# Patient Record
Sex: Male | Born: 1945 | Race: White | Hispanic: No | Marital: Married | Smoking: Never smoker
Health system: Southern US, Community
[De-identification: ages and names within clinical notes are randomized; demographics above are authoritative.]

## PROBLEM LIST (undated history)

## (undated) DIAGNOSIS — N400 Enlarged prostate without lower urinary tract symptoms: Secondary | ICD-10-CM

## (undated) DIAGNOSIS — S270XXA Traumatic pneumothorax, initial encounter: Secondary | ICD-10-CM

## (undated) DIAGNOSIS — E785 Hyperlipidemia, unspecified: Secondary | ICD-10-CM

## (undated) DIAGNOSIS — L409 Psoriasis, unspecified: Secondary | ICD-10-CM

## (undated) DIAGNOSIS — I10 Essential (primary) hypertension: Secondary | ICD-10-CM

## (undated) DIAGNOSIS — Z8249 Family history of ischemic heart disease and other diseases of the circulatory system: Secondary | ICD-10-CM

## (undated) DIAGNOSIS — K219 Gastro-esophageal reflux disease without esophagitis: Secondary | ICD-10-CM

## (undated) DIAGNOSIS — M1990 Unspecified osteoarthritis, unspecified site: Secondary | ICD-10-CM

## (undated) DIAGNOSIS — G20 Parkinson's disease: Secondary | ICD-10-CM

## (undated) HISTORY — DX: Parkinson's disease: G20

## (undated) HISTORY — DX: Family history of ischemic heart disease and other diseases of the circulatory system: Z82.49

## (undated) HISTORY — DX: Benign prostatic hyperplasia without lower urinary tract symptoms: N40.0

## (undated) HISTORY — PX: COLONOSCOPY: SHX174

## (undated) HISTORY — PX: HIP ARTHROPLASTY: SHX981

---

## 2005-02-21 ENCOUNTER — Ambulatory Visit: Payer: Self-pay | Admitting: Gastroenterology

## 2005-03-28 ENCOUNTER — Ambulatory Visit: Payer: Self-pay | Admitting: Gastroenterology

## 2006-05-21 ENCOUNTER — Ambulatory Visit: Payer: Self-pay | Admitting: Gastroenterology

## 2006-06-04 ENCOUNTER — Ambulatory Visit: Payer: Self-pay | Admitting: Gastroenterology

## 2012-04-27 HISTORY — PX: NM MYOCAR PERF WALL MOTION: HXRAD629

## 2012-05-27 HISTORY — PX: OTHER SURGICAL HISTORY: SHX169

## 2013-01-05 ENCOUNTER — Emergency Department (HOSPITAL_BASED_OUTPATIENT_CLINIC_OR_DEPARTMENT_OTHER)
Admission: EM | Admit: 2013-01-05 | Discharge: 2013-01-05 | Disposition: A | Discharge status: Home / Self Care | Payer: Medicare Other | Attending: Emergency Medicine | Admitting: Emergency Medicine

## 2013-01-05 ENCOUNTER — Encounter (HOSPITAL_BASED_OUTPATIENT_CLINIC_OR_DEPARTMENT_OTHER): Payer: Self-pay

## 2013-01-05 DIAGNOSIS — Z8639 Personal history of other endocrine, nutritional and metabolic disease: Secondary | ICD-10-CM | POA: Insufficient documentation

## 2013-01-05 DIAGNOSIS — X503XXA Overexertion from repetitive movements, initial encounter: Secondary | ICD-10-CM | POA: Insufficient documentation

## 2013-01-05 DIAGNOSIS — Y9389 Activity, other specified: Secondary | ICD-10-CM | POA: Insufficient documentation

## 2013-01-05 DIAGNOSIS — I10 Essential (primary) hypertension: Secondary | ICD-10-CM | POA: Insufficient documentation

## 2013-01-05 DIAGNOSIS — Z862 Personal history of diseases of the blood and blood-forming organs and certain disorders involving the immune mechanism: Secondary | ICD-10-CM | POA: Insufficient documentation

## 2013-01-05 DIAGNOSIS — M5432 Sciatica, left side: Secondary | ICD-10-CM

## 2013-01-05 DIAGNOSIS — M5430 Sciatica, unspecified side: Secondary | ICD-10-CM | POA: Insufficient documentation

## 2013-01-05 DIAGNOSIS — Z79899 Other long term (current) drug therapy: Secondary | ICD-10-CM | POA: Insufficient documentation

## 2013-01-05 DIAGNOSIS — Z7982 Long term (current) use of aspirin: Secondary | ICD-10-CM | POA: Insufficient documentation

## 2013-01-05 DIAGNOSIS — Y929 Unspecified place or not applicable: Secondary | ICD-10-CM | POA: Insufficient documentation

## 2013-01-05 DIAGNOSIS — Z872 Personal history of diseases of the skin and subcutaneous tissue: Secondary | ICD-10-CM | POA: Insufficient documentation

## 2013-01-05 DIAGNOSIS — K219 Gastro-esophageal reflux disease without esophagitis: Secondary | ICD-10-CM | POA: Insufficient documentation

## 2013-01-05 HISTORY — DX: Essential (primary) hypertension: I10

## 2013-01-05 HISTORY — DX: Gastro-esophageal reflux disease without esophagitis: K21.9

## 2013-01-05 HISTORY — DX: Psoriasis, unspecified: L40.9

## 2013-01-05 HISTORY — DX: Hyperlipidemia, unspecified: E78.5

## 2013-01-05 MED ORDER — OXYCODONE-ACETAMINOPHEN 5-325 MG PO TABS: 1.0000 | ORAL_TABLET | ORAL | Status: DC | PRN

## 2013-01-05 MED ORDER — OXYCODONE-ACETAMINOPHEN 5-325 MG PO TABS
1.0000 | ORAL_TABLET | Freq: Once | ORAL | Status: AC
  Administered 2013-01-05: 1 via ORAL
  Filled 2013-01-05 (×2): qty 1

## 2013-01-05 MED ORDER — CYCLOBENZAPRINE HCL 10 MG PO TABS: 10.0000 mg | ORAL_TABLET | Freq: Three times a day (TID) | ORAL | Status: DC | PRN

## 2013-01-05 MED ORDER — CYCLOBENZAPRINE HCL 10 MG PO TABS
10.0000 mg | ORAL_TABLET | Freq: Once | ORAL | Status: AC
  Administered 2013-01-05: 10 mg via ORAL
  Filled 2013-01-05: qty 1

## 2013-01-05 NOTE — ED Notes (Signed)
Pt states that he was loading wood onto a truck Saturday a week ago.  Pt states that he has severe back pain in the lower back bilaterally, radiating down the hips into the legs.  Pt states that pain increases with any position change or movement.  Ambulated with minor difficulty to the triage from waiting room.

## 2013-01-05 NOTE — ED Provider Notes (Signed)
History     CSN: [id number]  Arrival date & time 01/05/13  0916   First MD Initiated Contact with Patient 01/05/13 [phone]      Chief Complaint  Patient presents with  . Back Pain    (Consider location/radiation/quality/duration/timing/severity/associated sxs/prior treatment) Patient is a 67 y.o. male presenting with back pain. The history is provided by the patient and the spouse. No language interpreter was used.  Back Pain Location:  Lumbar spine (Pt had moved some firewood 8 days ago, and has had back pain since then.) Quality:  Shooting (Sharp, like lightning, going from low back to the left leg, down to the left knee.) Radiates to:  L posterior upper leg Pain severity:  Severe Onset quality:  Sudden Duration:  8 days Timing:  Constant Progression:  Worsening Context: lifting heavy objects   Relieved by:  Nothing Worsened by:  Movement, bending and twisting Ineffective treatments:  Lying down, being still and heating pad Associated symptoms: no bladder incontinence, no bowel incontinence, no fever and no numbness     Past Medical History  Diagnosis Date  . Hypertension   . Psoriasis   . GERD (gastroesophageal reflux disease)   . Hyperlipidemia     Past Surgical History  Procedure Laterality Date  . Hip arthroplasty      History reviewed. No pertinent family history.  History  Substance Use Topics  . Smoking status: Never Smoker   . Smokeless tobacco: Never Used  . Alcohol Use: No      Review of Systems  Constitutional: Negative.  Negative for fever and chills.  HENT: Negative.   Eyes: Negative.   Respiratory: Negative.   Cardiovascular: Negative.   Gastrointestinal: Negative.  Negative for bowel incontinence.  Genitourinary: Negative.  Negative for bladder incontinence.  Musculoskeletal: Positive for back pain.  Skin: Negative.   Neurological: Negative for numbness.  Psychiatric/Behavioral: Negative.     Allergies  Review of patient's allergies  indicates no known allergies.  Home Medications   Current Outpatient Rx  Name  Route  Sig  Dispense  Refill  . acetaminophen (TYLENOL) 500 MG tablet   Oral   Take 1,000 mg by mouth every 6 (six) hours as needed for pain.         [name] aspirin 81 MG tablet   Oral   Take 81 mg by mouth daily.         . fenofibrate (TRICOR) 145 MG tablet   Oral   Take 145 mg by mouth daily.         . metoprolol (LOPRESSOR) 100 MG tablet   Oral   Take 50 mg by mouth 2 (two) times daily.         [name] omeprazole (PRILOSEC) 20 MG capsule   Oral   Take 20 mg by mouth daily.         . cyclobenzaprine (FLEXERIL) 10 MG tablet   Oral   Take 1 tablet (10 mg total) by mouth 3 (three) times daily as needed for muscle spasms.   15 tablet   0   . oxyCODONE-acetaminophen (PERCOCET/ROXICET) 5-325 MG per tablet   Oral   Take 1 tablet by mouth every 4 (four) hours as needed for pain.   20 tablet   0     BP 123/80  Pulse 75  Temp(Src) 97.9 F (36.6 C) (Oral)  Resp 20  Ht 5\' 10"  (1.778 m)  Wt 215 lb (97.523 kg)  BMI 30.85 kg/m2  SpO2 98%  Physical  Exam  Constitutional: He is oriented to person, place, and time. He appears well-developed and well-nourished. Distressed: in moderate distress with low back pain radiating to the left leg.  HENT:  Head: Normocephalic and atraumatic.  Eyes: EOM are normal. Pupils are equal, round, and reactive to light.  Neck: Normal range of motion. Neck supple.  Cardiovascular: Normal rate and regular rhythm.   Pulmonary/Chest: Effort normal.  Abdominal: Soft. Bowel sounds are normal.  Musculoskeletal:  He localizes pain to the left lumbar region. There is no palpable bony deformity or point tenderness there.  Neurological: He is alert and oriented to person, place, and time.  No sensory or motor deficit.  Skin: Skin is warm and dry.  He has an extensive psoriatic rash on his back.  Psychiatric: He has a normal mood and affect. His behavior is normal.    ED  Course  Procedures (including critical care time)  10:28 AM Patient was advised that pain was probably related to injury to his lumbar disc which causes pain running into his left leg, a condition commonly called sciatica. He can take Percocet every 4 ours as needed for pain and Flexeril 10 mg 3 times a day for muscle spasm. He can use a heating pad with a low setting to try and get symptomatic relief. He should follow this up with Marjory Lies M.D., his family physician.    1. Sciatica neuralgia, left           Carleene Cooper III, MD 01/05/13 1029

## 2013-01-08 ENCOUNTER — Emergency Department (HOSPITAL_COMMUNITY)
Admission: EM | Admit: 2013-01-08 | Discharge: 2013-01-08 | Disposition: A | Discharge status: Home / Self Care | Payer: Medicare Other | Attending: Emergency Medicine | Admitting: Emergency Medicine

## 2013-01-08 ENCOUNTER — Encounter (HOSPITAL_COMMUNITY): Payer: Self-pay | Admitting: *Deleted

## 2013-01-08 DIAGNOSIS — Z7982 Long term (current) use of aspirin: Secondary | ICD-10-CM | POA: Insufficient documentation

## 2013-01-08 DIAGNOSIS — M549 Dorsalgia, unspecified: Secondary | ICD-10-CM

## 2013-01-08 DIAGNOSIS — I10 Essential (primary) hypertension: Secondary | ICD-10-CM | POA: Insufficient documentation

## 2013-01-08 DIAGNOSIS — E785 Hyperlipidemia, unspecified: Secondary | ICD-10-CM | POA: Insufficient documentation

## 2013-01-08 DIAGNOSIS — K219 Gastro-esophageal reflux disease without esophagitis: Secondary | ICD-10-CM | POA: Insufficient documentation

## 2013-01-08 DIAGNOSIS — Z872 Personal history of diseases of the skin and subcutaneous tissue: Secondary | ICD-10-CM | POA: Insufficient documentation

## 2013-01-08 DIAGNOSIS — M545 Low back pain, unspecified: Secondary | ICD-10-CM | POA: Insufficient documentation

## 2013-01-08 DIAGNOSIS — Z79899 Other long term (current) drug therapy: Secondary | ICD-10-CM | POA: Insufficient documentation

## 2013-01-08 MED ORDER — OXYCODONE-ACETAMINOPHEN 5-325 MG PO TABS: 1.0000 | ORAL_TABLET | ORAL | Status: DC | PRN

## 2013-01-08 MED ORDER — IBUPROFEN 800 MG PO TABS: 800.0000 mg | ORAL_TABLET | Freq: Three times a day (TID) | ORAL | Status: DC

## 2013-01-08 MED ORDER — CYCLOBENZAPRINE HCL 10 MG PO TABS: 10.0000 mg | ORAL_TABLET | Freq: Three times a day (TID) | ORAL | Status: DC | PRN

## 2013-01-08 MED ORDER — DIAZEPAM 5 MG PO TABS
5.0000 mg | ORAL_TABLET | Freq: Once | ORAL | Status: AC
  Administered 2013-01-08: 5 mg via ORAL
  Filled 2013-01-08: qty 1

## 2013-01-08 MED ORDER — KETOROLAC TROMETHAMINE 60 MG/2ML IM SOLN
60.0000 mg | Freq: Once | INTRAMUSCULAR | Status: AC
  Administered 2013-01-08: 60 mg via INTRAMUSCULAR
  Filled 2013-01-08: qty 2

## 2013-01-08 NOTE — ED Notes (Signed)
Pt able to ambulate in room. Reports pain 7/10

## 2013-01-08 NOTE — ED Provider Notes (Signed)
History     CSN: [id number]  Arrival date & time 01/08/13  1308   First MD Initiated Contact with Patient 01/08/13 [phone]      Chief Complaint  Patient presents with  . Back Pain    (Consider location/radiation/quality/duration/timing/severity/associated sxs/prior treatment) HPI Comments: Patient is a 67 year old male who presents with gradual onset of lower back pain after moving wood that started a couple months ago. The pain is dull, aching and severe and does radiate down bilateral legs. The pain is constant. Movement makes the pain worse. Nothing makes the pain better. Patient has tried Percocet and Flexeri for pain which provided some relief. No associated symptoms. No saddles paresthesias or bladder/bowel incontinence. Patient was seen here last week for the same and was recommended to follow up with his PCP which he did not.     Patient is a 67 y.o. male presenting with back pain.  Back Pain   Past Medical History  Diagnosis Date  . Hypertension   . Psoriasis   . GERD (gastroesophageal reflux disease)   . Hyperlipidemia     Past Surgical History  Procedure Laterality Date  . Hip arthroplasty      History reviewed. No pertinent family history.  History  Substance Use Topics  . Smoking status: Never Smoker   . Smokeless tobacco: Never Used  . Alcohol Use: No      Review of Systems  Musculoskeletal: Positive for back pain.  All other systems reviewed and are negative.    Allergies  Review of patient's allergies indicates no known allergies.  Home Medications   Current Outpatient Rx  Name  Route  Sig  Dispense  Refill  . acetaminophen (TYLENOL) 500 MG tablet   Oral   Take 1,000 mg by mouth every 6 (six) hours as needed for pain.         [name] aspirin 81 MG tablet   Oral   Take 81 mg by mouth daily.         . cyclobenzaprine (FLEXERIL) 10 MG tablet   Oral   Take 1 tablet (10 mg total) by mouth 3 (three) times daily as needed for muscle spasms.  15 tablet   0   . fenofibrate (TRICOR) 145 MG tablet   Oral   Take 145 mg by mouth daily.         . fish oil-omega-3 fatty acids 1000 MG capsule   Oral   Take 2 g by mouth daily.         . Flaxseed, Linseed, (FLAX SEEDS PO)   Oral   Take 1 tablet by mouth every morning.         . metoprolol (LOPRESSOR) 100 MG tablet   Oral   Take 50 mg by mouth 2 (two) times daily.         [name] omeprazole (PRILOSEC) 20 MG capsule   Oral   Take 20 mg by mouth daily.         [name] oxyCODONE-acetaminophen (PERCOCET/ROXICET) 5-325 MG per tablet   Oral   Take 1 tablet by mouth every 4 (four) hours as needed for pain.   20 tablet   0     BP 138/85  Pulse 103  Temp(Src) 97.3 F (36.3 C) (Oral)  Resp 20  SpO2 96%  Physical Exam  Nursing note and vitals reviewed. Constitutional: He is oriented to person, place, and time. He appears well-developed and well-nourished. No distress.  HENT:  Head: Normocephalic and atraumatic.  Eyes: Conjunctivae and EOM are normal.  Neck: Normal range of motion. Neck supple.  Cardiovascular: Normal rate and regular rhythm.  Exam reveals no gallop and no friction rub.   No murmur heard. Pulmonary/Chest: Effort normal and breath sounds normal. He has no wheezes. He has no rales. He exhibits no tenderness.  Abdominal: Soft. He exhibits no distension. There is no tenderness. There is no rebound.  Musculoskeletal: Normal range of motion.  Tenderness to palpation of L5 and paraspinal areas.   Neurological: He is alert and oriented to person, place, and time. Coordination normal.  Speech is goal-oriented. Moves limbs without ataxia.   Skin: Skin is warm and dry.  Psychiatric: He has a normal mood and affect. His behavior is normal.    ED Course  Procedures (including critical care time)  Labs Reviewed - No data to display No results found.   1. Back pain       MDM  9:53 AM Patient given toradol and valium for pain. Patient will follow up with his  PCP. I will also give him the name of an Orthopedist to follow up with. Patient will have 800mg  iburprofen, Percocet, and robaxin prescriptions for pain. No bladder/bowel incontinence or saddle paresthesias.         Emilia Beck, PA-C 01/08/13 1018

## 2013-01-08 NOTE — ED Notes (Signed)
Pt alert and oriented x4. Respirations even and unlabored, bilateral symmetrical rise and fall of chest. Skin warm and dry. In no acute distress. Denies needs.   

## 2013-01-08 NOTE — ED Notes (Signed)
Pt seen in ED 2/9. Pt reports bil lower back pain. Radiates down legs. Has been to the chiropractor last 2 days with no relief. Decided to come back to ED. Pain upon movement 10/10

## 2013-01-08 NOTE — ED Notes (Signed)
Pt escorted to discharge window. Pt verbalized understanding discharge instructions. In no acute distress.  

## 2013-01-09 NOTE — ED Provider Notes (Signed)
Medical screening examination/treatment/procedure(s) were performed by non-physician practitioner and as supervising physician I was immediately available for consultation/collaboration.  Hoke Baer T Keyonta Madrid, MD 01/09/13 0752 

## 2013-01-13 ENCOUNTER — Encounter (HOSPITAL_COMMUNITY): Payer: Self-pay | Admitting: Pharmacy Technician

## 2013-01-14 DIAGNOSIS — M5126 Other intervertebral disc displacement, lumbar region: Secondary | ICD-10-CM | POA: Diagnosis present

## 2013-01-14 NOTE — H&P (Signed)
History of Present Illness The patient is a 67 year old male who presents today for follow up of their back. The patient is being followed for their back pain. The patient states that they are doing poorly. Note for "Follow-up back": discuss  Subjective Transcription  He returns today for preoperative evaluation and review of his MRI. He continues to have severe, disabling left anterior thigh pain.  Allergies No Known Drug Allergies.  Social History Tobacco use. never smoker  Family History Heart Disease. mother   Social History Alcohol use. never consumed alcohol Children. 2 Current work status. retired Financial planner (Currently). no Drug/Alcohol Rehab (Previously). no Exercise. Exercises rarely; does running / walking Illicit drug use. no Living situation. live with spouse Marital status. married Pain Contract. no Tobacco use. never smoker  Past Surgical History Other Orthopaedic Surgery   Other Problems Gastroesophageal Reflux Disease High blood pressure Hypercholesterolemia   Review of Systems General:Not Present- Chills, Fever, Night Sweats, Appetite Loss, Fatigue, Feeling sick, Weight Gain and Weight Loss. Skin:Present- Itching, Rash and Psoriasis. Not Present- Skin Color Changes, Ulcer and Change in Hair or Nails. HEENT:Not Present- Sensitivity to light, Hearing problems, Nose Bleed and Ringing in the Ears. Neck:Not Present- Swollen Glands and Neck Mass. Respiratory:Not Present- Snoring, Chronic Cough, Bloody sputum and Dyspnea. Cardiovascular:Not Present- Shortness of Breath, Chest Pain, Swelling of Extremities, Leg Cramps and Palpitations. Gastrointestinal:Not Present- Bloody Stool, Heartburn, Abdominal Pain, Vomiting, Nausea and Incontinence of Stool. Male Genitourinary:Not Present- Blood in Urine, Frequency, Incontinence and Nocturia. Musculoskeletal:Present- Muscle Weakness, Muscle Pain and Back Pain. Not  Present- Joint Stiffness, Joint Swelling and Joint Pain. Neurological:Present- Tingling and Burning. Not Present- Numbness, Tremor, Headaches and Dizziness. Psychiatric:Not Present- Anxiety, Depression and Memory Loss. Hematology:Not Present- Abnormal Bleeding, Anemia, Blood Clots and Easy Bruising.  Medication History Cyclobenzaprine HCl (10MG  Tablet, Oral) Active. Fenofibrate (145MG  Tablet, Oral) Active. Metoprolol Tartrate (100MG  Tablet, Oral) Active. Oxycodone-Acetaminophen (5-325MG  Tablet, Oral) Active. Triamcinolone Acetonide (0.1% Cream, External) Active. Fish Oil Concentrate (435MG  Capsule, 1 (one) Oral) Active. Aspirin (81MG  Tablet, 1 (one) Oral) Active. Flax Seed Oil (1000MG  Capsule, Oral) Active. PriLOSEC OTC (20MG  Tablet DR, Oral) Active. Vitamin E Active.   Objective Transcription  He has difficulty walking because of the pain. He has some hip flexor weakness as well as quad strength weakness on that left side but there is also severe pain. His EHL, tibialis anterior, gastrocnemius are intact bilaterally. He also has numbness and dysesthesias over the anterior left thigh. He has horrific back pain with palpation and range of motion. No obvious skin lesions, abrasions, contusions of the lumbar spine. Lungs are clear to auscultation. Regular rate and rhythm. No chest pain or shortness of breath. Abdomen is soft, nontender. No history of incontinence of bowel or bladder. He is alert and oriented times three.  RADIOGRAPHS:  The MRI clearly shows a large L2-3 left sided disc herniation causing significant posterolateral compression of the traversing L3 nerve root. He also has a grade 1 borderline 2 spondylolisthesis and degenerative disc disease at L5-S1.  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  Plans Transcription  Given its size I would recommend a lumbar decompression discectomy. We did discuss potential treatments with injections in physical therapy but given the size of it and the severity of his pain I think it is reasonable to  proceed with a surgical solution. The risks include infection, bleeding, nerve damage, death, stroke, paralysis, failure to heal, need for further surgery, ongoing or worse pain, loss of bowel and bladder control, recurrent disc herniation, blood clots and leakage of spinal fluid. All of his and his wife's questions were addressed. They were present for the dictation. The plan will be to do the decompression this Thursday with a 23 hour admission. Potentially he may be able to go home the same day. We will see how he is doing postoperatively.  Alvy Beal, MD  Patient cleared for surgery by PCP Dr. Doristine Counter

## 2013-01-15 ENCOUNTER — Encounter (HOSPITAL_COMMUNITY): Payer: Self-pay

## 2013-01-15 ENCOUNTER — Encounter (HOSPITAL_COMMUNITY)
Admission: RE | Admit: 2013-01-15 | Discharge: 2013-01-15 | Disposition: A | Discharge status: Home / Self Care | Payer: Medicare Other | Source: Ambulatory Visit | Attending: Anesthesiology | Admitting: Anesthesiology

## 2013-01-15 ENCOUNTER — Encounter (HOSPITAL_COMMUNITY)
Admission: RE | Admit: 2013-01-15 | Discharge: 2013-01-15 | Disposition: A | Discharge status: Home / Self Care | Payer: Medicare Other | Source: Ambulatory Visit | Attending: Orthopedic Surgery | Admitting: Orthopedic Surgery

## 2013-01-15 HISTORY — DX: Unspecified osteoarthritis, unspecified site: M19.90

## 2013-01-15 HISTORY — DX: Traumatic pneumothorax, initial encounter: S27.0XXA

## 2013-01-15 LAB — CBC
Hemoglobin: 15.4 g/dL (ref 13.0–17.0)
MCHC: 34.6 g/dL (ref 30.0–36.0)
RBC: 5.01 MIL/uL (ref 4.22–5.81)
WBC: 7.5 10*3/uL (ref 4.0–10.5)

## 2013-01-15 LAB — BASIC METABOLIC PANEL
BUN: 17 mg/dL (ref 6–23)
GFR calc Af Amer: >90 mL/min (ref >90)
GFR calc non Af Amer: 86 mL/min — ABNORMAL LOW (ref >90)
Potassium: 3.9 mEq/L (ref 3.5–5.1)
Sodium: 138 mEq/L (ref 135–145)

## 2013-01-15 LAB — SURGICAL PCR SCREEN
MRSA, PCR: POSITIVE — AB
Staphylococcus aureus: POSITIVE — AB

## 2013-01-15 MED ORDER — DEXAMETHASONE SODIUM PHOSPHATE 4 MG/ML IJ SOLN
4.0000 mg | Freq: Once | INTRAMUSCULAR | Status: DC
  Filled 2013-01-15: qty 1

## 2013-01-15 MED ORDER — ACETAMINOPHEN 10 MG/ML IV SOLN
1000.0000 mg | Freq: Once | INTRAVENOUS | Status: AC
  Administered 2013-01-16: 1000 mg via INTRAVENOUS
  Filled 2013-01-15: qty 100

## 2013-01-15 MED ORDER — CEFAZOLIN SODIUM-DEXTROSE 2-3 GM-% IV SOLR
2.0000 g | INTRAVENOUS | Status: AC
  Administered 2013-01-16: 2 g via INTRAVENOUS
  Filled 2013-01-15: qty 50

## 2013-01-15 NOTE — Pre-Procedure Instructions (Addendum)
Randy [name]  01/15/2013   Your procedure is scheduled on:  Thursday, February 20th.  Report to [facility] at 5:30AM.  Call this number if you have problems the morning of surgery: [phone]   Remember:   Do not eat food or drink liquids after midnight.    Take these medicines the morning of surgery with A SIP OF WATER: Metoprolol  (Lopressor), Omeprazole (Prilosec).  May take Flexeril and Oxycodone- Acetaminophen (Percocet) if needed.    Do not wear jewelry, make-up or nail polish.  Do not wear lotions, powders, or perfumes. You may wear deodorant.             Men may shave face and neck.  Do not bring valuables to the hospital.  Contacts, dentures or bridgework may not be worn into surgery.  Leave suitcase in the car. After surgery it may be brought to your room.  For patients admitted to the hospital, checkout time is 11:00 AM the day of discharge.   Patients discharged the day of surgery will not be allowed to drive home.  Name and phone number of your driver:     Special Instructions: Shower with CHG wash (Bactoshield) tonight and again in the am prior to arriving to hospital.   Please read over the following fact sheets that you were given: Pain Booklet, Coughing and Deep Breathing and Surgical Site Infection Prevention

## 2013-01-15 NOTE — Progress Notes (Signed)
Pt was seen by Dr Allyson Sabal last July after having chest pain. Pt states that he had a stress test and EKG.  Pt denies having any chest pain since.  I faxed a request to [facility] requesting EKG, Stress Test and last office note.

## 2013-01-16 ENCOUNTER — Ambulatory Visit (HOSPITAL_COMMUNITY): Payer: Medicare Other

## 2013-01-16 ENCOUNTER — Observation Stay (HOSPITAL_COMMUNITY): Payer: Medicare Other

## 2013-01-16 ENCOUNTER — Ambulatory Visit (HOSPITAL_COMMUNITY): Payer: Medicare Other | Admitting: Anesthesiology

## 2013-01-16 ENCOUNTER — Encounter (HOSPITAL_COMMUNITY)
Admission: RE | Disposition: A | Discharge status: Home / Self Care | Payer: Self-pay | Source: Ambulatory Visit | Attending: Orthopedic Surgery

## 2013-01-16 ENCOUNTER — Observation Stay (HOSPITAL_COMMUNITY)
Admission: RE | Admit: 2013-01-16 | Discharge: 2013-01-17 | Disposition: A | Discharge status: Home / Self Care | Payer: Medicare Other | Source: Ambulatory Visit | Attending: Orthopedic Surgery | Admitting: Orthopedic Surgery

## 2013-01-16 ENCOUNTER — Encounter (HOSPITAL_COMMUNITY): Payer: Self-pay | Admitting: *Deleted

## 2013-01-16 ENCOUNTER — Encounter (HOSPITAL_COMMUNITY): Payer: Self-pay | Admitting: Anesthesiology

## 2013-01-16 DIAGNOSIS — Z01812 Encounter for preprocedural laboratory examination: Secondary | ICD-10-CM | POA: Insufficient documentation

## 2013-01-16 DIAGNOSIS — I10 Essential (primary) hypertension: Secondary | ICD-10-CM | POA: Insufficient documentation

## 2013-01-16 DIAGNOSIS — E785 Hyperlipidemia, unspecified: Secondary | ICD-10-CM | POA: Insufficient documentation

## 2013-01-16 DIAGNOSIS — M5126 Other intervertebral disc displacement, lumbar region: Principal | ICD-10-CM | POA: Insufficient documentation

## 2013-01-16 DIAGNOSIS — Z01818 Encounter for other preprocedural examination: Secondary | ICD-10-CM | POA: Insufficient documentation

## 2013-01-16 HISTORY — PX: LUMBAR LAMINECTOMY/DECOMPRESSION MICRODISCECTOMY: SHX5026

## 2013-01-16 SURGERY — LUMBAR LAMINECTOMY/DECOMPRESSION MICRODISCECTOMY 1 LEVEL
Anesthesia: General | Laterality: Left | Wound class: Clean

## 2013-01-16 MED ORDER — ACETAMINOPHEN 10 MG/ML IV SOLN
INTRAVENOUS | Status: AC
  Filled 2013-01-16: qty 100

## 2013-01-16 MED ORDER — ROCURONIUM BROMIDE 100 MG/10ML IV SOLN
INTRAVENOUS | Status: DC | PRN
  Administered 2013-01-16: 30 mg via INTRAVENOUS
  Administered 2013-01-16: 20 mg via INTRAVENOUS
  Administered 2013-01-16: 50 mg via INTRAVENOUS

## 2013-01-16 MED ORDER — SODIUM CHLORIDE 0.9 % IV SOLN: 250.0000 mL | INTRAVENOUS | Status: DC

## 2013-01-16 MED ORDER — PHENOL 1.4 % MT LIQD: 1.0000 | OROMUCOSAL | Status: DC | PRN

## 2013-01-16 MED ORDER — SODIUM CHLORIDE 0.9 % IJ SOLN: 3.0000 mL | Freq: Two times a day (BID) | INTRAMUSCULAR | Status: DC

## 2013-01-16 MED ORDER — MORPHINE SULFATE 2 MG/ML IJ SOLN: 1.0000 mg | INTRAMUSCULAR | Status: DC | PRN

## 2013-01-16 MED ORDER — OXYCODONE HCL 5 MG/5ML PO SOLN: 5.0000 mg | Freq: Once | ORAL | Status: DC | PRN

## 2013-01-16 MED ORDER — OXYCODONE-ACETAMINOPHEN 10-325 MG PO TABS: 1.0000 | ORAL_TABLET | ORAL | Status: DC | PRN

## 2013-01-16 MED ORDER — OXYCODONE HCL 5 MG PO TABS: 5.0000 mg | ORAL_TABLET | Freq: Once | ORAL | Status: DC | PRN

## 2013-01-16 MED ORDER — CEFAZOLIN SODIUM 1-5 GM-% IV SOLN
1.0000 g | Freq: Three times a day (TID) | INTRAVENOUS | Status: AC
  Administered 2013-01-16 – 2013-01-17 (×2): 1 g via INTRAVENOUS
  Filled 2013-01-16 (×2): qty 50

## 2013-01-16 MED ORDER — DEXAMETHASONE SODIUM PHOSPHATE 4 MG/ML IJ SOLN
4.0000 mg | Freq: Four times a day (QID) | INTRAMUSCULAR | Status: DC
  Filled 2013-01-16 (×7): qty 1

## 2013-01-16 MED ORDER — DEXAMETHASONE 4 MG PO TABS
4.0000 mg | ORAL_TABLET | Freq: Four times a day (QID) | ORAL | Status: DC
  Administered 2013-01-16 – 2013-01-17 (×4): 4 mg via ORAL
  Filled 2013-01-16 (×7): qty 1

## 2013-01-16 MED ORDER — ACETAMINOPHEN 10 MG/ML IV SOLN
1000.0000 mg | Freq: Four times a day (QID) | INTRAVENOUS | Status: AC
  Administered 2013-01-16 – 2013-01-17 (×4): 1000 mg via INTRAVENOUS
  Filled 2013-01-16 (×4): qty 100

## 2013-01-16 MED ORDER — ONDANSETRON HCL 4 MG PO TABS: 4.0000 mg | ORAL_TABLET | Freq: Three times a day (TID) | ORAL | Status: DC | PRN

## 2013-01-16 MED ORDER — HYDROMORPHONE HCL PF 1 MG/ML IJ SOLN: 0.2500 mg | INTRAMUSCULAR | Status: DC | PRN

## 2013-01-16 MED ORDER — METHOCARBAMOL 100 MG/ML IJ SOLN
500.0000 mg | Freq: Four times a day (QID) | INTRAVENOUS | Status: DC | PRN
  Filled 2013-01-16: qty 5

## 2013-01-16 MED ORDER — BUPIVACAINE-EPINEPHRINE 0.25% -1:200000 IJ SOLN
INTRAMUSCULAR | Status: DC | PRN
  Administered 2013-01-16: 10 mL

## 2013-01-16 MED ORDER — POLYETHYLENE GLYCOL 3350 17 GM/SCOOP PO POWD: 17.0000 g | Freq: Every day | ORAL | Status: DC

## 2013-01-16 MED ORDER — OXYCODONE HCL 5 MG PO TABS
10.0000 mg | ORAL_TABLET | ORAL | Status: DC | PRN
  Administered 2013-01-16: 10 mg via ORAL
  Filled 2013-01-16: qty 2

## 2013-01-16 MED ORDER — ZOLPIDEM TARTRATE 5 MG PO TABS: 5.0000 mg | ORAL_TABLET | Freq: Every evening | ORAL | Status: DC | PRN

## 2013-01-16 MED ORDER — LACTATED RINGERS IV SOLN
INTRAVENOUS | Status: DC
  Administered 2013-01-16: 15:00:00 via INTRAVENOUS
  Administered 2013-01-17: 85 mL/h via INTRAVENOUS

## 2013-01-16 MED ORDER — METHOCARBAMOL 500 MG PO TABS
500.0000 mg | ORAL_TABLET | Freq: Four times a day (QID) | ORAL | Status: DC | PRN
  Administered 2013-01-16: 500 mg via ORAL
  Filled 2013-01-16: qty 1

## 2013-01-16 MED ORDER — DOCUSATE SODIUM 100 MG PO CAPS
100.0000 mg | ORAL_CAPSULE | Freq: Two times a day (BID) | ORAL | Status: DC
  Administered 2013-01-16 – 2013-01-17 (×2): 100 mg via ORAL
  Filled 2013-01-16 (×2): qty 1

## 2013-01-16 MED ORDER — MENTHOL 3 MG MT LOZG: 1.0000 | LOZENGE | OROMUCOSAL | Status: DC | PRN

## 2013-01-16 MED ORDER — THROMBIN 20000 UNITS EX SOLR
CUTANEOUS | Status: DC | PRN
  Administered 2013-01-16: 09:00:00 via TOPICAL

## 2013-01-16 MED ORDER — PHENYLEPHRINE HCL 10 MG/ML IJ SOLN
INTRAMUSCULAR | Status: DC | PRN
  Administered 2013-01-16 (×8): 80 ug via INTRAVENOUS

## 2013-01-16 MED ORDER — FLEET ENEMA 7-19 GM/118ML RE ENEM: 1.0000 | ENEMA | Freq: Once | RECTAL | Status: AC | PRN

## 2013-01-16 MED ORDER — GLYCOPYRROLATE 0.2 MG/ML IJ SOLN
INTRAMUSCULAR | Status: DC | PRN
  Administered 2013-01-16: 0.6 mg via INTRAVENOUS

## 2013-01-16 MED ORDER — MUPIROCIN 2 % EX OINT
TOPICAL_OINTMENT | CUTANEOUS | Status: AC
  Administered 2013-01-16: 06:00:00
  Filled 2013-01-16: qty 22

## 2013-01-16 MED ORDER — METOPROLOL TARTRATE 50 MG PO TABS
50.0000 mg | ORAL_TABLET | Freq: Two times a day (BID) | ORAL | Status: DC
  Administered 2013-01-17: 50 mg via ORAL
  Filled 2013-01-16 (×3): qty 1

## 2013-01-16 MED ORDER — THROMBIN 20000 UNITS EX SOLR
CUTANEOUS | Status: AC
  Filled 2013-01-16: qty 20000

## 2013-01-16 MED ORDER — DOCUSATE SODIUM 100 MG PO CAPS: 100.0000 mg | ORAL_CAPSULE | Freq: Three times a day (TID) | ORAL | Status: DC | PRN

## 2013-01-16 MED ORDER — PROPOFOL 10 MG/ML IV BOLUS
INTRAVENOUS | Status: DC | PRN
  Administered 2013-01-16: 200 mg via INTRAVENOUS

## 2013-01-16 MED ORDER — ONDANSETRON HCL 4 MG/2ML IJ SOLN: 4.0000 mg | INTRAMUSCULAR | Status: DC | PRN

## 2013-01-16 MED ORDER — SODIUM CHLORIDE 0.9 % IJ SOLN: 3.0000 mL | INTRAMUSCULAR | Status: DC | PRN

## 2013-01-16 MED ORDER — MIDAZOLAM HCL 5 MG/5ML IJ SOLN
INTRAMUSCULAR | Status: DC | PRN
  Administered 2013-01-16: 2 mg via INTRAVENOUS

## 2013-01-16 MED ORDER — LIDOCAINE HCL (CARDIAC) 20 MG/ML IV SOLN
INTRAVENOUS | Status: DC | PRN
  Administered 2013-01-16: 100 mg via INTRAVENOUS

## 2013-01-16 MED ORDER — TRIAMCINOLONE ACETONIDE 0.1 % EX CREA
1.0000 "application " | TOPICAL_CREAM | Freq: Three times a day (TID) | CUTANEOUS | Status: DC
  Administered 2013-01-16 – 2013-01-17 (×3): 1 via TOPICAL
  Filled 2013-01-16: qty 15

## 2013-01-16 MED ORDER — FENTANYL CITRATE 0.05 MG/ML IJ SOLN
INTRAMUSCULAR | Status: DC | PRN
  Administered 2013-01-16 (×3): 50 ug via INTRAVENOUS
  Administered 2013-01-16: 100 ug via INTRAVENOUS

## 2013-01-16 MED ORDER — DEXAMETHASONE SODIUM PHOSPHATE 10 MG/ML IJ SOLN
INTRAMUSCULAR | Status: AC
  Administered 2013-01-16: 10 mg via INTRAVENOUS
  Filled 2013-01-16: qty 1

## 2013-01-16 MED ORDER — ONDANSETRON HCL 4 MG/2ML IJ SOLN
INTRAMUSCULAR | Status: DC | PRN
  Administered 2013-01-16 (×2): 4 mg via INTRAVENOUS

## 2013-01-16 MED ORDER — NEOSTIGMINE METHYLSULFATE 1 MG/ML IJ SOLN
INTRAMUSCULAR | Status: DC | PRN
  Administered 2013-01-16: 3 mg via INTRAVENOUS

## 2013-01-16 MED ORDER — HYDROMORPHONE HCL PF 1 MG/ML IJ SOLN
INTRAMUSCULAR | Status: AC
  Filled 2013-01-16: qty 2

## 2013-01-16 MED ORDER — ALBUMIN HUMAN 5 % IV SOLN
INTRAVENOUS | Status: DC | PRN
  Administered 2013-01-16: 10:00:00 via INTRAVENOUS

## 2013-01-16 MED ORDER — 0.9 % SODIUM CHLORIDE (POUR BTL) OPTIME
TOPICAL | Status: DC | PRN
  Administered 2013-01-16: 1000 mL

## 2013-01-16 MED ORDER — SODIUM CHLORIDE 0.9 % IV SOLN
10.0000 mg | INTRAVENOUS | Status: DC | PRN
  Administered 2013-01-16: 10 ug/min via INTRAVENOUS

## 2013-01-16 MED ORDER — LACTATED RINGERS IV SOLN
INTRAVENOUS | Status: DC | PRN
  Administered 2013-01-16 (×2): via INTRAVENOUS

## 2013-01-16 MED ORDER — METOCLOPRAMIDE HCL 5 MG/ML IJ SOLN: 10.0000 mg | Freq: Once | INTRAMUSCULAR | Status: DC | PRN

## 2013-01-16 SURGICAL SUPPLY — 60 items
BANDAGE GAUZE ELAST BULKY 4 IN (GAUZE/BANDAGES/DRESSINGS) ×2 IMPLANT
BUR EGG ELITE 4.0 (BURR) IMPLANT
BUR MATCHSTICK NEURO 3.0 LAGG (BURR) ×2 IMPLANT
CANISTER SUCTION 2500CC (MISCELLANEOUS) ×2 IMPLANT
CLOTH BEACON ORANGE TIMEOUT ST (SAFETY) ×2 IMPLANT
CLSR STERI-STRIP ANTIMIC 1/2X4 (GAUZE/BANDAGES/DRESSINGS) ×2 IMPLANT
CORDS BIPOLAR (ELECTRODE) ×2 IMPLANT
COVER SURGICAL LIGHT HANDLE (MISCELLANEOUS) ×2 IMPLANT
DERMABOND ADVANCED (GAUZE/BANDAGES/DRESSINGS) ×1
DERMABOND ADVANCED .7 DNX12 (GAUZE/BANDAGES/DRESSINGS) ×1 IMPLANT
DRAIN CHANNEL 15F RND FF W/TCR (WOUND CARE) ×2 IMPLANT
DRAPE POUCH INSTRU U-SHP 10X18 (DRAPES) ×2 IMPLANT
DRAPE PROXIMA HALF (DRAPES) ×2 IMPLANT
DRAPE SURG 17X23 STRL (DRAPES) ×2 IMPLANT
DRAPE U-SHAPE 47X51 STRL (DRAPES) ×2 IMPLANT
DRSG MEPILEX BORDER 4X4 (GAUZE/BANDAGES/DRESSINGS) ×2 IMPLANT
DRSG MEPILEX BORDER 4X8 (GAUZE/BANDAGES/DRESSINGS) ×2 IMPLANT
DURAPREP 26ML APPLICATOR (WOUND CARE) ×2 IMPLANT
ELECT BLADE 4.0 EZ CLEAN MEGAD (MISCELLANEOUS)
ELECT CAUTERY BLADE 6.4 (BLADE) ×2 IMPLANT
ELECT REM PT RETURN 9FT ADLT (ELECTROSURGICAL) ×2
ELECTRODE BLDE 4.0 EZ CLN MEGD (MISCELLANEOUS) IMPLANT
ELECTRODE REM PT RTRN 9FT ADLT (ELECTROSURGICAL) ×1 IMPLANT
EVACUATOR 1/8 PVC DRAIN (DRAIN) IMPLANT
EVACUATOR SILICONE 100CC (DRAIN) ×2 IMPLANT
GLOVE BIOGEL PI IND STRL 6.5 (GLOVE) ×2 IMPLANT
GLOVE BIOGEL PI IND STRL 8.5 (GLOVE) ×2 IMPLANT
GLOVE BIOGEL PI INDICATOR 6.5 (GLOVE) ×2
GLOVE BIOGEL PI INDICATOR 8.5 (GLOVE) ×2
GLOVE ECLIPSE 6.0 STRL STRAW (GLOVE) ×4 IMPLANT
GLOVE ECLIPSE 8.5 STRL (GLOVE) ×4 IMPLANT
GOWN PREVENTION PLUS XXLARGE (GOWN DISPOSABLE) ×4 IMPLANT
GOWN STRL NON-REIN LRG LVL3 (GOWN DISPOSABLE) ×4 IMPLANT
KIT BASIN OR (CUSTOM PROCEDURE TRAY) ×2 IMPLANT
KIT ROOM TURNOVER OR (KITS) ×2 IMPLANT
NEEDLE 22X1 1/2 (OR ONLY) (NEEDLE) ×2 IMPLANT
NEEDLE SPNL 18GX3.5 QUINCKE PK (NEEDLE) ×4 IMPLANT
NS IRRIG 1000ML POUR BTL (IV SOLUTION) ×2 IMPLANT
PACK LAMINECTOMY ORTHO (CUSTOM PROCEDURE TRAY) ×2 IMPLANT
PACK UNIVERSAL I (CUSTOM PROCEDURE TRAY) ×2 IMPLANT
PAD ARMBOARD 7.5X6 YLW CONV (MISCELLANEOUS) ×4 IMPLANT
PATTIES SURGICAL .5 X.5 (GAUZE/BANDAGES/DRESSINGS) ×2 IMPLANT
PATTIES SURGICAL .5 X1 (DISPOSABLE) IMPLANT
SPONGE LAP 4X18 X RAY DECT (DISPOSABLE) ×4 IMPLANT
SPONGE SURGIFOAM ABS GEL 100 (HEMOSTASIS) ×2 IMPLANT
STRIP CLOSURE SKIN 1/2X4 (GAUZE/BANDAGES/DRESSINGS) ×2 IMPLANT
SURGIFLO TRUKIT (HEMOSTASIS) ×4 IMPLANT
SUT ETHILON 2 0 FS 18 (SUTURE) ×2 IMPLANT
SUT MNCRL AB 3-0 PS2 18 (SUTURE) ×2 IMPLANT
SUT VIC AB 0 CT1 27 (SUTURE) ×1
SUT VIC AB 0 CT1 27XBRD ANBCTR (SUTURE) ×1 IMPLANT
SUT VIC AB 1 CTX 36 (SUTURE) ×2
SUT VIC AB 1 CTX36XBRD ANBCTR (SUTURE) ×2 IMPLANT
SUT VIC AB 2-0 CT1 18 (SUTURE) ×2 IMPLANT
SYR BULB IRRIGATION 50ML (SYRINGE) ×2 IMPLANT
SYR CONTROL 10ML LL (SYRINGE) ×2 IMPLANT
TOWEL OR 17X24 6PK STRL BLUE (TOWEL DISPOSABLE) ×2 IMPLANT
TOWEL OR 17X26 10 PK STRL BLUE (TOWEL DISPOSABLE) ×2 IMPLANT
WATER STERILE IRR 1000ML POUR (IV SOLUTION) ×2 IMPLANT
YANKAUER SUCT BULB TIP NO VENT (SUCTIONS) ×2 IMPLANT

## 2013-01-16 NOTE — Anesthesia Preprocedure Evaluation (Addendum)
Anesthesia Evaluation  Patient identified by MRN, date of birth, ID band Patient awake    Reviewed: Allergy & Precautions, H&P , NPO status , Patient's Chart, lab work & pertinent test results, reviewed documented beta blocker date and time   Airway Mallampati: II TM Distance: >3 FB Neck ROM: full    Dental  (+) Teeth Intact   Pulmonary neg pulmonary ROS,  breath sounds clear to auscultation        Cardiovascular hypertension, On Medications and On Home Beta Blockers Rhythm:regular     Neuro/Psych negative neurological ROS  negative psych ROS   GI/Hepatic Neg liver ROS, GERD-  Medicated and Controlled,  Endo/Other  negative endocrine ROS  Renal/GU negative Renal ROS  negative genitourinary   Musculoskeletal   Abdominal   Peds  Hematology negative hematology ROS (+)   Anesthesia Other Findings See surgeon's H&P   Reproductive/Obstetrics negative OB ROS                          Anesthesia Physical Anesthesia Plan  ASA: II  Anesthesia Plan: General   Post-op Pain Management:    Induction: Intravenous  Airway Management Planned: Oral ETT  Additional Equipment:   Intra-op Plan:   Post-operative Plan: Extubation in OR  Informed Consent: I have reviewed the patients History and Physical, chart, labs and discussed the procedure including the risks, benefits and alternatives for the proposed anesthesia with the patient or authorized representative who has indicated his/her understanding and acceptance.   Dental Advisory Given  Plan Discussed with: CRNA and Surgeon  Anesthesia Plan Comments:         Anesthesia Quick Evaluation

## 2013-01-16 NOTE — Anesthesia Postprocedure Evaluation (Signed)
Anesthesia Post Note  Patient: Randy [name]  Procedure(s) Performed: Procedure(s) (LRB): LUMBAR LAMINECTOMY/DECOMPRESSION MICRODISCECTOMY 1 LEVEL (Left)  Anesthesia type: General  Patient location: PACU  Post pain: Pain level controlled  Post assessment: Patient's Cardiovascular Status Stable  Last Vitals:  Filed Vitals:   01/16/13 1302  BP: 125/73  Pulse:   Temp:   Resp:     Post vital signs: Reviewed and stable  Level of consciousness: alert  Complications: No apparent anesthesia complications

## 2013-01-16 NOTE — H&P (Signed)
No change to clinical exam H+P reviewed 

## 2013-01-16 NOTE — Preoperative (Signed)
Beta Blockers   Reason not to administer Beta Blockers:Not Applicable 

## 2013-01-16 NOTE — Op Note (Signed)
NAMELINCOLN, Randy [name]             ACCOUNT NO.:  [id number]  MEDICAL RECORD NO.:  [id number]  LOCATION:  5N12C                        FACILITY:  [facility]  PHYSICIAN:  Alvy Beal, MD    DATE OF BIRTH:  [birth year]  DATE OF PROCEDURE:  01/16/2013 DATE OF DISCHARGE:                              OPERATIVE REPORT   PREOPERATIVE DIAGNOSIS:  A L2-3 left-sided central disk herniation.  POSTOPERATIVE DIAGNOSIS:  A L2-3 left-sided central disk herniation.  OPERATIVE PROCEDURE:  A L2-3 central and lateral decompression for diskectomy.  COMPLICATIONS:  None.  CONDITION:  Stable.  HISTORY:  This is a very pleasant gentleman who presents with a 3-week history of severe progressive back, buttock, and bilateral leg pain, left side worse than the right.  The patient was referred to me.  He was having quad weakness and severe pain.  An MRI demonstrated a large central and left-sided disk herniation at L2-3.  As a result of its size and severe pain, we elected to proceed with surgery.  All appropriate risks, benefits, and alternatives to the surgery were discussed. Consent was obtained.  OPERATIVE NOTE:  The patient was brought to the operating room, placed supine on the operating table.  After successful induction of general anesthesia and endotracheal intubation, TEDs, SCDs, and Foley were inserted.  He was turned prone onto the [place] frame.  All bony prominences were well padded.  The back was prepped and draped in standard fashion.  Time-out was done confirming the patient, procedure, and all other pertinent important data.  Once this was done, I made a midline incision centered over the L2-L3 disk space.  Sharp dissection was carried out down to the deep fascia.  Deep fascia was incised and I stripped the paraspinal muscles bilaterally to expose the entire posterior aspect of the L2-L3 space.  I then placed a Penfield 4 underneath the lamina of L2 and then took an intraoperative x-ray  to confirm that I was indeed at the L2-L3 space.  Once this was done, I then removed the spinous process of L2 with double-action Leksell rongeur, and then performed a bilateral laminotomy with a 2 and 3-mm Kerrison.  Because the disk fragment was larger on the left, I did a much more extensive laminotomy near complete laminectomy on that left side.  I then released the ligamentum flavum and removed it to expose the posterior aspect of the thecal sac.  There was significant large epidural veins noted and obvious displacement of the thecal sac.  I was able to eventually identify the L3 nerve root and the lateral aspect of the thecal sac and protected with a nerve root retractor.  I then used a nerve hook to get through the posterior anulus and into the disk fragment.  I then was able to deliver the fragments into the posterior lateral left gutter.  Then using a combination of micropituitary rongeurs, Epstein curettes, I removed 3 very large fragments of disk, some small fragments.  I then went into the disk space itself with micro pituitary rongeur to ensure there is no other free fragments.  At this point, there was no undue tension on the L3 nerve root and the central portion  of the thecal sac had been decompressed with removal of the spinous process and laminotomy.  I then palpated over on the right side with my [facility], and there was no significant compression.  I then irrigated copiously with normal saline.  I then used bipolar electrocautery and FloSeal to obtain hemostasis.  Because of the large epidural veins and significant blood loss, I did placed a drain.  After last irrigation, I then placed a thrombin-soaked Gelfoam patty over the exposed thecal sac.  Prior to this, I did sweep circumferentially with a Woodson elevator down the lateral recess superiorly and laterally and medially to confirm I had removed all fragments of disk.  Once this was completed, I placed  thrombin-soaked Gelfoam patty over the exposed thecal sac, closed the deep fascia with interrupted #1 Vicryl sutures, superficial 2-0 Vicryl sutures, and a 3-0 Monocryl for the skin.  I also stitched in the drain.  I then placed Steri-Strips and a dry dressing. The patient was extubated, transferred to PACU without incident.  At the end of the case, all needle and sponge counts were correct.  There was no adverse intraoperative events.     Alvy Beal, MD     DDB/MEDQ  D:  01/16/2013  T:  01/16/2013  Job:  [id number]
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
## 2013-01-16 NOTE — Brief Op Note (Signed)
01/16/2013  11:05 AM  PATIENT:  Randy [name]  67 y.o. male  PRE-OPERATIVE DIAGNOSIS:  LEFT L2-3 DISCARNATION   POST-OPERATIVE DIAGNOSIS:  LEFT L2-3 DISCARNATION   PROCEDURE:  Procedure(s) with comments: LUMBAR LAMINECTOMY/DECOMPRESSION MICRODISCECTOMY 1 LEVEL (Left) - L2-3 DECOMPRESSION DISCECTOMY   SURGEON:  Surgeon(s) and Role:    * Venita Lick, MD - Primary  PHYSICIAN ASSISTANT:   ASSISTANTS: none   ANESTHESIA:   general  EBL:  Total I/O In: 1250 [I.V.:1000; IV Piggyback:250] Out: 750 [Urine:400; Blood:350]  BLOOD ADMINISTERED:none  DRAINS: JP in the back   LOCAL MEDICATIONS USED:  MARCAINE     SPECIMEN:  No Specimen  DISPOSITION OF SPECIMEN:  N/A  COUNTS:  YES  TOURNIQUET:  * No tourniquets in log *  DICTATION: .Other Dictation: Dictation Number [id number]  PLAN OF CARE: Admit for overnight observation  PATIENT DISPOSITION:  PACU - hemodynamically stable.

## 2013-01-16 NOTE — Transfer of Care (Signed)
Immediate Anesthesia Transfer of Care Note  Patient: Randy [name]  Procedure(s) Performed: Procedure(s) with comments: LUMBAR LAMINECTOMY/DECOMPRESSION MICRODISCECTOMY 1 LEVEL (Left) - L2-3 DECOMPRESSION DISCECTOMY   Patient Location: PACU  Anesthesia Type:General  Level of Consciousness: awake, alert  and oriented  Airway & Oxygen Therapy: Patient Spontanous Breathing and Patient connected to nasal cannula oxygen  Post-op Assessment: Report given to PACU RN, Post -op Vital signs reviewed and stable and Patient moving all extremities X 4  Post vital signs: Reviewed and stable  Complications: No apparent anesthesia complications

## 2013-01-16 NOTE — Anesthesia Procedure Notes (Signed)
Procedure Name: Intubation Date/Time: 01/16/2013 7:56 AM Performed by: Quentin Ore Pre-anesthesia Checklist: Patient identified, Emergency Drugs available, Suction available, Patient being monitored and Timeout performed Patient Re-evaluated:Patient Re-evaluated prior to inductionOxygen Delivery Method: Circle system utilized Preoxygenation: Pre-oxygenation with 100% oxygen Intubation Type: IV induction Ventilation: Mask ventilation without difficulty and Oral airway inserted - appropriate to patient size Laryngoscope Size: Mac and 3 Grade View: Grade III Tube type: Oral Tube size: 7.5 mm Number of attempts: 1 Airway Equipment and Method: Stylet Placement Confirmation: positive ETCO2 and breath sounds checked- equal and bilateral Secured at: 22 cm Tube secured with: Tape Dental Injury: Teeth and Oropharynx as per pre-operative assessment

## 2013-01-17 ENCOUNTER — Encounter (HOSPITAL_COMMUNITY): Payer: Self-pay | Admitting: Orthopedic Surgery

## 2013-01-17 NOTE — Progress Notes (Signed)
     Subjective: Procedure(s) (LRB): LUMBAR LAMINECTOMY/DECOMPRESSION MICRODISCECTOMY 1 LEVEL (Left) 1 Day Post-Op  Patient reports pain as 2 on 0-10 scale.  Reports decreased leg pain minimal incisional back pain   Positive void Negative bowel movement Positive flatus Negative chest pain or shortness of breath  Objective: Vital signs in last 24 hours: Temp:  [97.7 F (36.5 C)-98.5 F (36.9 C)] 97.9 F (36.6 C) (02/21 0606) Pulse Rate:  [87-102] 95 (02/21 0606) Resp:  [15-21] 18 (02/21 0606) BP: (99-149)/(59-94) 127/59 mmHg (02/21 0606) SpO2:  [90 %-99 %] 99 % (02/21 0606)  Intake/Output from previous day: 02/20 0701 - 02/21 0700 In: 3643.1 [P.O.:1440; I.V.:1928.1; IV Piggyback:250] Out: 4513 [Urine:4075; Drains:88; Blood:350]  Labs:  Recent Labs  01/15/13 1349  WBC 7.5  RBC 5.01  HCT 44.5  PLT 270    Recent Labs  01/15/13 1349  NA 138  K 3.9  CL 100  CO2 27  BUN 17  CREATININE 0.93  GLUCOSE 78  CALCIUM 10.3   No results found for this basename: LABPT, INR,  in the last 72 hours  Physical Exam: Neurologically intact ABD soft Neurovascular intact Intact pulses distally Incision: dressing C/D/I and no drainage No cellulitis present Compartment soft Drain -  88cc overnight Assessment/Plan: Patient stable  xrays n/a Continue mobilization with physical therapy Continue care  Advance diet Up with therapy D/C IV fluids If drain remains below 50 over next 4 hrs then ok to remove and d/c to home this afternoon  Venita Lick, MD [facility] [phone]

## 2013-01-17 NOTE — Plan of Care (Signed)
Problem: Phase II Progression Outcomes Goal: Discharge plan established No follow up OT recommended after acute care d/c, no equipment needed

## 2013-01-17 NOTE — Progress Notes (Signed)
UR COMPLETED  

## 2013-01-17 NOTE — Evaluation (Signed)
Occupational Therapy Evaluation Patient Details Name: Randy [name] MRN: [id number] DOB: 1946-02-10 Today's Date: 01/17/2013 Time: 0940-1000 OT Time Calculation (min): 20 min  OT Assessment / Plan / Recommendation Clinical Impression  Pt referred to OT services following back surgery. Pt set up/sup level with UB ADLs, min A with LB ADLs and sup/Mod I with functional mobility. No further acute OT needed at this time, all education completed and OT will sign off    OT Assessment  Patient does not need any further OT services    Follow Up Recommendations  No OT follow up    Barriers to Discharge  None    Equipment Recommendations  None recommended by OT    Recommendations for Other Services    Frequency       Precautions / Restrictions Precautions Precautions: Back Precaution Comments: Reviewed back precautions with pt and his wife Restrictions Weight Bearing Restrictions: No   Pertinent Vitals/Pain     ADL  Grooming: Performed;Wash/dry hands;Wash/dry face;Supervision/safety Upper Body Bathing: Supervision/safety;Set up;Simulated Lower Body Bathing: Simulated;Minimal assistance Upper Body Dressing: Performed;Supervision/safety;Set up Lower Body Dressing: Minimal assistance Toilet Transfer: Performed;Supervision/safety Toilet Transfer Method: Sit to stand;Other (comment) (ambulating from RW level) Toilet Transfer Equipment: Regular height toilet;Raised toilet seat with arms (or 3-in-1 over toilet) Toileting - Clothing Manipulation and Hygiene: Performed;Min guard Where Assessed - Toileting Clothing Manipulation and Hygiene: Standing Transfers/Ambulation Related to ADLs: Pt andwife stated that they have comfort heigth toilet and tub bench at home ADL Comments: pt and wife educated on ADL A/E for [place] at home    OT Diagnosis:    OT Problem List:   OT Treatment Interventions:     OT Goals    Visit Information  Last OT Received On: 01/17/13 Assistance Needed:  +1 PT/OT Co-Evaluation/Treatment: Yes    Subjective Data  Subjective: " I am doing ok " Patient Stated Goal: To return home   Prior Functioning     Home Living Lives With: Spouse Available Help at Discharge: Family;Available 24 hours/day Type of Home: House Home Access: Stairs to enter [employer] of Steps: 1 Entrance Stairs-Rails: None Home Layout: One level Bathroom Shower/Tub: Engineer, manufacturing systems: Standard Home Adaptive Equipment: Hand-held shower hose;Tub transfer bench;Reacher Prior Function Level of Independence: Independent Able to Take Stairs?: Yes Driving: Yes Communication Communication: No difficulties Dominant Hand: Right         Vision/Perception Vision - History Baseline Vision: Wears glasses only for reading Patient Visual Report: No change from baseline Vision - Assessment Eye Alignment: Within Functional Limits Perception Perception: Within Functional Limits   Cognition  Cognition Overall Cognitive Status: Appears within functional limits for tasks assessed/performed Arousal/Alertness: Awake/alert Orientation Level: Appears intact for tasks assessed Behavior During Session: [facility] for tasks performed    Extremity/Trunk Assessment Right Upper Extremity Assessment RUE ROM/Strength/Tone: [facility] for tasks assessed Left Upper Extremity Assessment LUE ROM/Strength/Tone: WFL for tasks assessed Right Lower Extremity Assessment RLE ROM/Strength/Tone: WFL for tasks assessed RLE Sensation: WFL - Light Touch Left Lower Extremity Assessment LLE ROM/Strength/Tone: WFL for tasks assessed LLE Sensation: WFL - Light Touch Trunk Assessment Trunk Assessment: Normal     Mobility Bed Mobility Bed Mobility: Not assessed Transfers Transfers: Sit to Stand;Stand to Sit Sit to Stand: 6: Modified independent (Device/Increase time);5: Supervision Stand to Sit: 6: Modified independent (Device/Increase time);5: Supervision Details for Transfer  Assistance: pt demos good use of UEs to A with transfers.  Only requires increased time to complete.       Exercise  Balance Balance Balance Assessed: No   End of Session OT - End of Session Equipment Utilized During Treatment:  (3 in 1) Activity Tolerance: Patient tolerated treatment well Patient left: in chair;with call bell/phone within reach;with family/visitor present  GO Functional Limitation: Self care Self Care Current Status (Z6109): At least 1 percent but less than 20 percent impaired, limited or restricted Self Care Goal Status (U0454): At least 1 percent but less than 20 percent impaired, limited or restricted Self Care Discharge Status [phone]): At least 1 percent but less than 20 percent impaired, limited or restricted   Galen Manila 01/17/2013, 11:10 AM

## 2013-01-17 NOTE — Evaluation (Signed)
Physical Therapy Evaluation Patient Details Name: Randy [name] MRN: [id number] DOB: 1946-09-11 Today's Date: 01/17/2013 Time: 0940-1000 PT Time Calculation (min): 20 min  PT Assessment / Plan / Recommendation Clinical Impression  pt presents with L2-3 Decompression and Diskectomy.  pt moving great and ModI for all activity.  pt has no further PT needs at this time, but would benefit from OPPT once cleared by MD.      PT Assessment  Patent does not need any further PT services    Follow Up Recommendations  No PT follow up    Does the patient have the potential to tolerate intense rehabilitation      Barriers to Discharge        Equipment Recommendations  None recommended by PT    Recommendations for Other Services     Frequency      Precautions / Restrictions Precautions Precaution Comments: Order for back brace, however pt notes Dr Shon Baton told hime this am that he did not have to use brace.   Restrictions Weight Bearing Restrictions: No   Pertinent Vitals/Pain Indicates very minimal pain.        Mobility  Bed Mobility Bed Mobility: Not assessed Transfers Transfers: Sit to Stand;Stand to Sit Sit to Stand: 6: Modified independent (Device/Increase time);With upper extremity assist;From bed;From chair/3-in-1 Stand to Sit: 6: Modified independent (Device/Increase time);With upper extremity assist;To chair/3-in-1;With armrests Details for Transfer Assistance: pt demos good use of UEs to A with transfers.  Only requires increased time to complete.   Ambulation/Gait Ambulation/Gait Assistance: 6: Modified independent (Device/Increase time) Ambulation Distance (Feet): 50 Feet (pt observed ambulating around unit earlier this am.  ) Assistive device: None Ambulation/Gait Assistance Details: pt moves slowly, but safely.    Gait Pattern: Step-through pattern;Decreased stride length Stairs: No Wheelchair Mobility Wheelchair Mobility: No    Exercises     PT Diagnosis:     PT Problem List:   PT Treatment Interventions:     PT Goals    Visit Information  Last PT Received On: 01/17/13 Assistance Needed: +1 PT/OT Co-Evaluation/Treatment: Yes    Subjective Data  Subjective: I just felt like my heart raced a little, but I walked 2 laps.   Patient Stated Goal: Home   Prior Functioning  Home Living Lives With: Spouse Available Help at Discharge: Family;Available 24 hours/day Type of Home: House Home Access: Stairs to enter [employer] of Steps: 1 Entrance Stairs-Rails: None Home Layout: One level Bathroom Shower/Tub: Engineer, manufacturing systems: Standard Home Adaptive Equipment: Hand-held shower hose;Tub transfer bench;Reacher Prior Function Level of Independence: Independent Able to Take Stairs?: Yes Driving: Yes Communication Communication: No difficulties    Cognition  Cognition Overall Cognitive Status: Appears within functional limits for tasks assessed/performed Arousal/Alertness: Awake/alert Orientation Level: Appears intact for tasks assessed Behavior During Session: [facility] for tasks performed    Extremity/Trunk Assessment Right Lower Extremity Assessment RLE ROM/Strength/Tone: WFL for tasks assessed RLE Sensation: WFL - Light Touch Left Lower Extremity Assessment LLE ROM/Strength/Tone: WFL for tasks assessed LLE Sensation: WFL - Light Touch Trunk Assessment Trunk Assessment: Normal   Balance Balance Balance Assessed: No  End of Session PT - End of Session Activity Tolerance: Patient tolerated treatment well Patient left: in chair;with call bell/phone within reach;with family/visitor present Nurse Communication: Mobility status  GP Functional Assessment Tool Used: Clinical Judgement Functional Limitation: Mobility: Walking and moving around Mobility: Walking and Moving Around Current Status (N8295): 0 percent impaired, limited or restricted Mobility: Walking and Moving Around Goal Status (A2130):  0 percent  impaired, limited or restricted Mobility: Walking and Moving Around Discharge Status [phone]): 0 percent impaired, limited or restricted   Sunny Schlein, [place] [phone] 01/17/2013, 11:02 AM

## 2013-01-17 NOTE — Progress Notes (Signed)
D/C instructions reviewed with patient and wife. RX x 4 given. No hh services or equipment needed. All questions answered. Pt d/c'ed via wheelchair in stable condition

## 2014-02-26 ENCOUNTER — Encounter: Payer: Self-pay | Admitting: *Deleted

## 2014-03-02 ENCOUNTER — Ambulatory Visit (INDEPENDENT_AMBULATORY_CARE_PROVIDER_SITE_OTHER): Payer: Medicare HMO | Admitting: Cardiovascular Disease

## 2014-03-02 ENCOUNTER — Encounter: Payer: Self-pay | Admitting: Cardiovascular Disease

## 2014-03-02 VITALS — BP 110/82 | HR 75 | Ht 70.0 in | Wt 212.4 lb

## 2014-03-02 DIAGNOSIS — E785 Hyperlipidemia, unspecified: Secondary | ICD-10-CM

## 2014-03-02 DIAGNOSIS — I10 Essential (primary) hypertension: Secondary | ICD-10-CM | POA: Insufficient documentation

## 2014-03-02 DIAGNOSIS — Z8249 Family history of ischemic heart disease and other diseases of the circulatory system: Secondary | ICD-10-CM | POA: Insufficient documentation

## 2014-03-02 NOTE — Patient Instructions (Signed)
Your physician recommends that you schedule a follow-up appointment in 1 YEAR.  

## 2014-03-02 NOTE — Progress Notes (Signed)
     03/02/2014 Randy [name]   Jan 30, 1946  [id number]  Primary Physician Stephens Shire, MD Primary Cardiologist: Lorretta Harp MD Renae Gloss   HPI:  The patient is a very pleasant, 68 year old, moderately overweight, married Caucasian male, father of 2 without I last saw in the office 03/03/13. He has risk factors that include hypertension, hyperlipidemia and family history. He had a Myoview stress test that was normal except for an abnormal electrocardiographic response to exercise and underwent MET-testing that was normal as well. He has been asymptomatic otherwise. Dr. Tollie Pizza follows his lipid profile.   Current Outpatient Prescriptions  Medication Sig Dispense Refill  . aspirin 81 MG tablet Take 81 mg by mouth daily.      . fenofibrate (TRICOR) 145 MG tablet Take 145 mg by mouth daily.      . fexofenadine (ALLEGRA) 180 MG tablet Take 180 mg by mouth daily.      . fish oil-omega-3 fatty acids 1000 MG capsule Take 2 g by mouth daily.      . Flaxseed, Linseed, (FLAX SEEDS PO) Take 2 tablets by mouth every morning.       . metoprolol (LOPRESSOR) 100 MG tablet Take 50 mg by mouth 2 (two) times daily.      [name] omeprazole (PRILOSEC) 20 MG capsule Take 20 mg by mouth daily.      [name] triamcinolone cream (KENALOG) 0.1 % Apply 1 application topically 3 (three) times daily.       No current facility-administered medications for this visit.    No Known Allergies  History   Social History  . Marital Status: Married    Spouse Name: N/A    Number of Children: N/A  . Years of Education: N/A   Occupational History  . Not on file.   Social History Main Topics  . Smoking status: Never Smoker   . Smokeless tobacco: Never Used  . Alcohol Use: No  . Drug Use: No  . Sexual Activity: Not on file   Other Topics Concern  . Not on file   Social History Narrative  . No narrative on file     Review of Systems: General: negative for chills, fever, night sweats or weight  changes.  Cardiovascular: negative for chest pain, dyspnea on exertion, edema, orthopnea, palpitations, paroxysmal nocturnal dyspnea or shortness of breath Dermatological: negative for rash Respiratory: negative for cough or wheezing Urologic: negative for hematuria Abdominal: negative for nausea, vomiting, diarrhea, bright red blood per rectum, melena, or hematemesis Neurologic: negative for visual changes, syncope, or dizziness All other systems reviewed and are otherwise negative except as noted above.    Blood pressure 110/82, pulse 75, height $RemoveBe'5\' 10"'AirDABtEI$  (1.778 m), weight 212 lb 6.4 oz (96.344 kg).  General appearance: alert and no distress Neck: no adenopathy, no carotid bruit, no JVD, supple, symmetrical, trachea midline and thyroid not enlarged, symmetric, no tenderness/mass/nodules Lungs: clear to auscultation bilaterally Heart: regular rate and rhythm, S1, S2 normal, no murmur, click, rub or gallop Extremities: extremities normal, atraumatic, no cyanosis or edema  EKG normal sinus rhythm at 75 without ST or T wave changes  ASSESSMENT AND PLAN:   Essential hypertension Well-controlled on current medications  Hyperlipidemia On fenofibrate. Followed by his primary care physician      Lorretta Harp MD [facility], [facility] 03/02/2014 3:40 PM

## 2014-03-02 NOTE — Assessment & Plan Note (Signed)
On fenofibrate. Followed by his primary care physician

## 2014-03-02 NOTE — Assessment & Plan Note (Signed)
Well-controlled on current medications 

## 2014-03-09 IMAGING — CR DG LUMBAR SPINE 2-3V
2 series · 2 of 2 positions shown · non-contrast
Comparison: Two views lumbar spine 01/15/2013.

CLINICAL DATA: L2-3 microdiskectomy.

LUMBAR SPINE - 2-3 VIEW

[xtable lateral (1 of 2)]
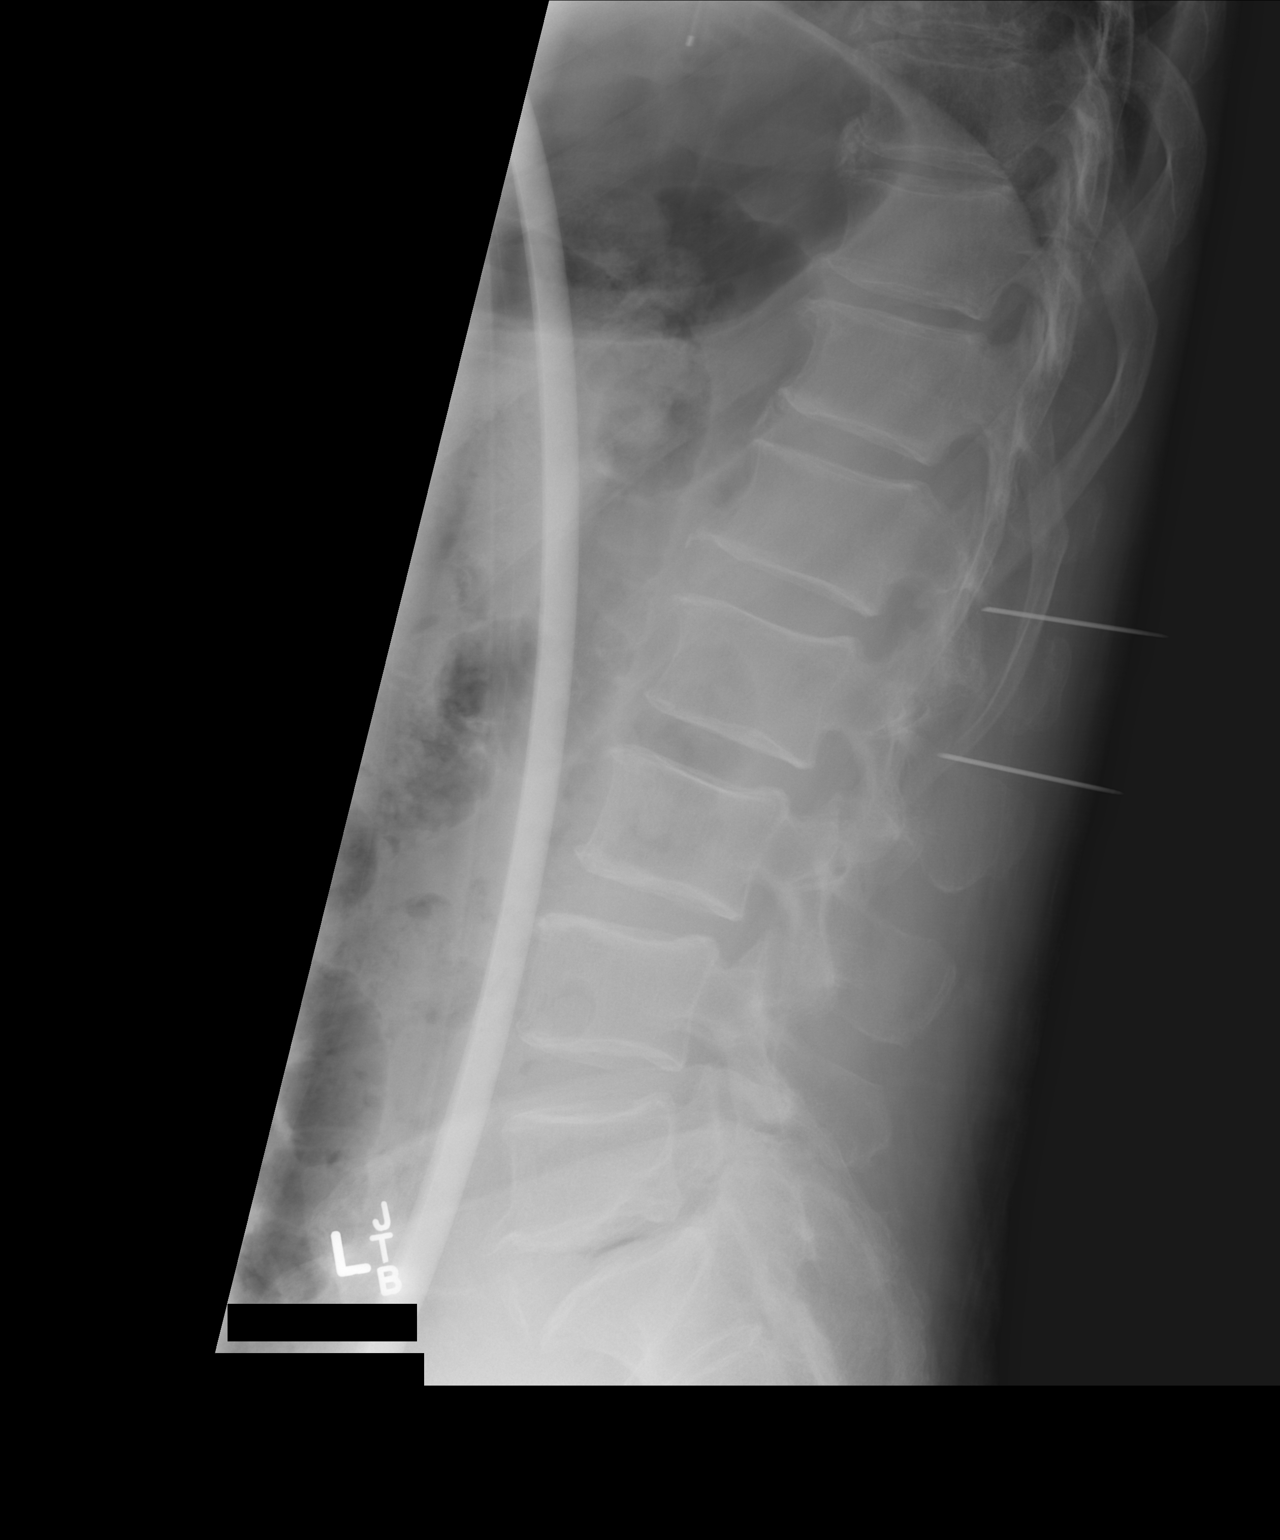

[xtable lateral (2 of 2)]
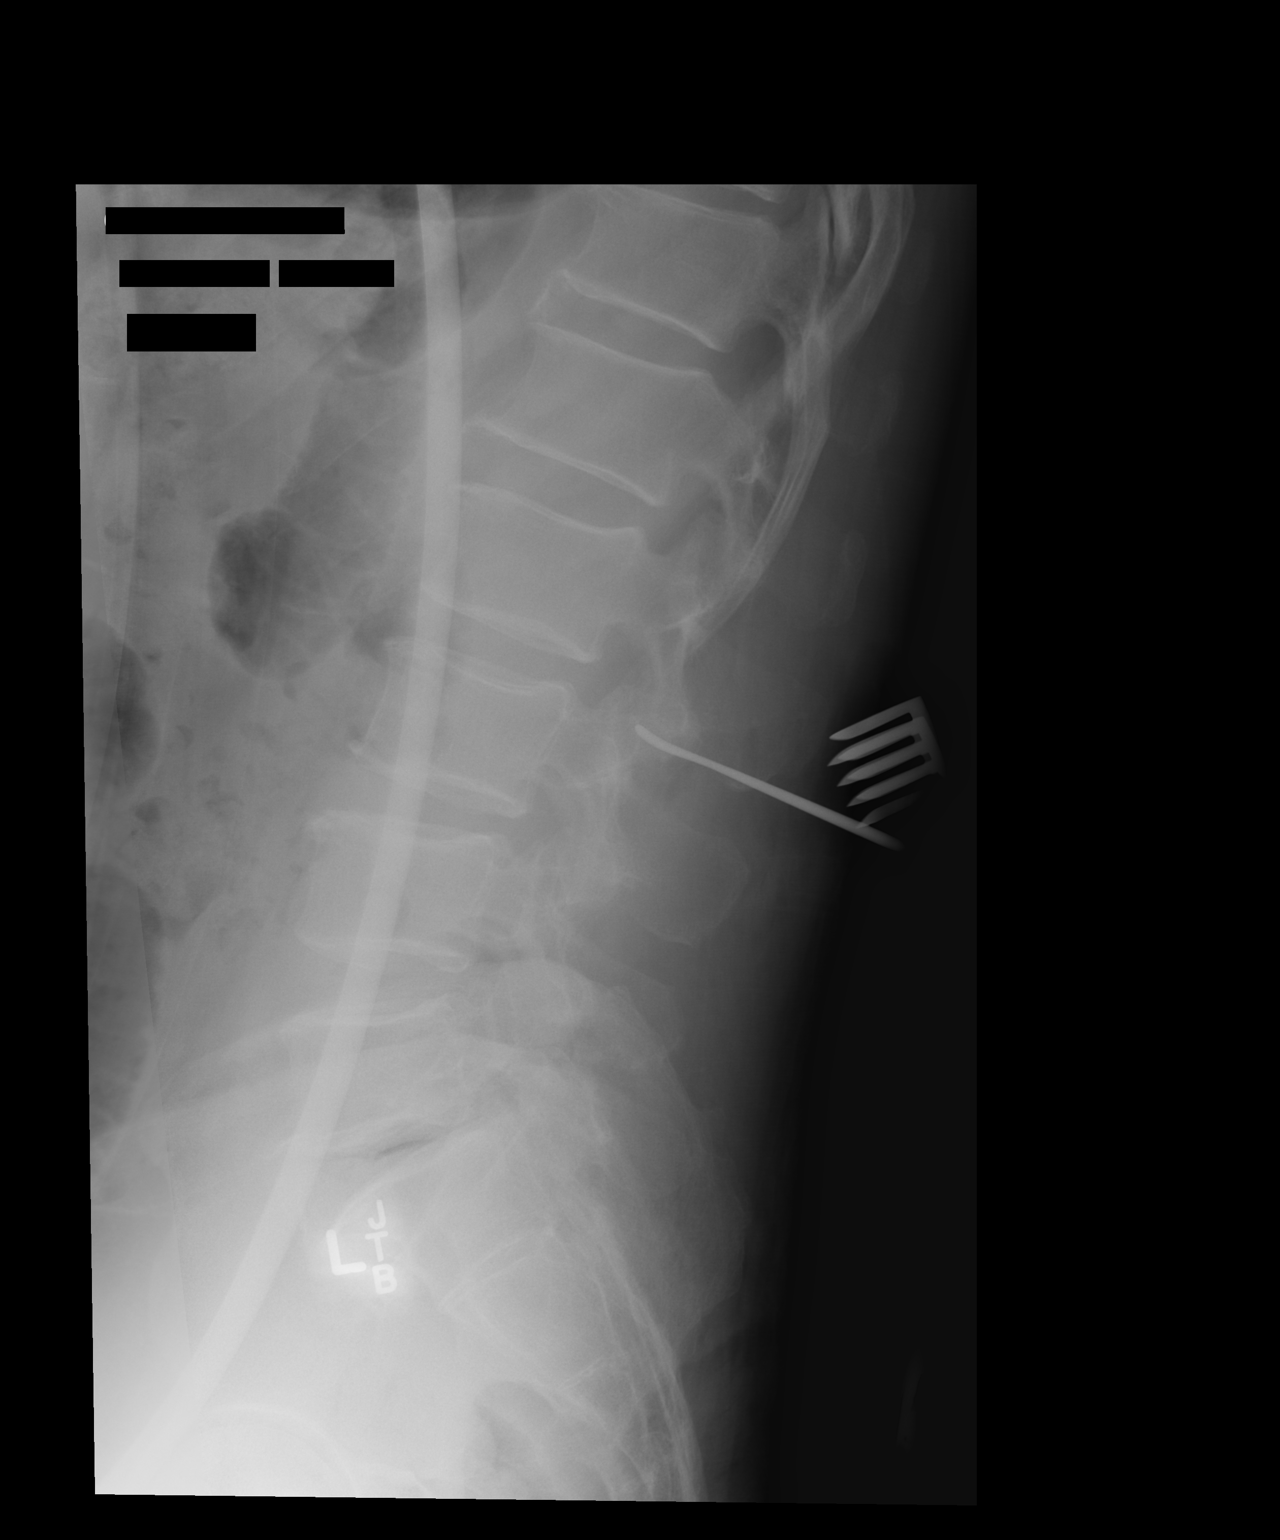

[2 of 2 positions shown; findings below may reference images not displayed]

FINDINGS: Two intraoperative views of the lumbar spine in the
lateral projection are provided.  On film labeled #1, probes are at
the level of the inferior aspects of the L1 and L2 pedicles.  On
film labeled #2, probe are directed toward the L2-3 interspace.
IMPRESSION: L2-3 localization.

## 2015-11-09 ENCOUNTER — Encounter: Payer: Self-pay | Admitting: Cardiovascular Disease

## 2015-11-09 ENCOUNTER — Ambulatory Visit (INDEPENDENT_AMBULATORY_CARE_PROVIDER_SITE_OTHER): Payer: PPO | Admitting: Cardiovascular Disease

## 2015-11-09 VITALS — BP 124/76 | HR 71 | Ht 69.0 in | Wt 212.0 lb

## 2015-11-09 DIAGNOSIS — I10 Essential (primary) hypertension: Secondary | ICD-10-CM | POA: Diagnosis not present

## 2015-11-09 DIAGNOSIS — E785 Hyperlipidemia, unspecified: Secondary | ICD-10-CM

## 2015-11-09 NOTE — Progress Notes (Signed)
     11/09/2015 Randy [name]   [birth year]  [id number]  Primary Physician Stephens Shire, MD Primary Cardiologist: Lorretta Harp MD Renae Gloss   HPI:  The patient is a very pleasant, 69 year old, moderately overweight, married Caucasian male, father of 2 without I last saw in the office 4 /6/15.[name] He has risk factors that include hypertension, hyperlipidemia and family history. He had a Myoview stress test that was normal except for an abnormal electrocardiographic response to exercise and underwent MET-testing that was normal as well. He has been asymptomatic otherwise. Dr. Tollie Pizza follows his lipid profile.   Current Outpatient Prescriptions  Medication Sig Dispense Refill  . aspirin 81 MG tablet Take 81 mg by mouth daily.    . fenofibrate (TRICOR) 145 MG tablet Take 145 mg by mouth daily.    . fish oil-omega-3 fatty acids 1000 MG capsule Take 2 g by mouth daily.    . Flaxseed, Linseed, (FLAX SEEDS PO) Take 2 tablets by mouth every morning.     . metoprolol (LOPRESSOR) 100 MG tablet Take 50 mg by mouth 2 (two) times daily.    [name] omeprazole (PRILOSEC) 20 MG capsule Take 20 mg by mouth daily.    [name] triamcinolone cream (KENALOG) 0.1 % Apply 1 application topically 3 (three) times daily.     No current facility-administered medications for this visit.    No Known Allergies  Social History   Social History  . Marital Status: Married    Spouse Name: N/A  . Number of Children: N/A  . Years of Education: N/A   Occupational History  . Not on file.   Social History Main Topics  . Smoking status: Never Smoker   . Smokeless tobacco: Never Used  . Alcohol Use: No  . Drug Use: No  . Sexual Activity: Not on file   Other Topics Concern  . Not on file   Social History Narrative     Review of Systems: General: negative for chills, fever, night sweats or weight changes.  Cardiovascular: negative for chest pain, dyspnea on exertion, edema, orthopnea, palpitations,  paroxysmal nocturnal dyspnea or shortness of breath Dermatological: negative for rash Respiratory: negative for cough or wheezing Urologic: negative for hematuria Abdominal: negative for nausea, vomiting, diarrhea, bright red blood per rectum, melena, or hematemesis Neurologic: negative for visual changes, syncope, or dizziness All other systems reviewed and are otherwise negative except as noted above.    Blood pressure 124/76, pulse 71, height _0  (1.753 m), weight 212 lb (96.163 kg).  General appearance: alert and no distress Neck: no adenopathy, no carotid bruit, no JVD, supple, symmetrical, trachea midline and thyroid not enlarged, symmetric, no tenderness/mass/nodules Lungs: clear to auscultation bilaterally Heart: regular rate and rhythm, S1, S2 normal, no murmur, click, rub or gallop Extremities: extremities normal, atraumatic, no cyanosis or edema  EKG normal sinus rhythm at 71 without ST or T-wave changes. I personally reviewed this EKG  ASSESSMENT AND PLAN:   Hyperlipidemia History of hyperlipidemia on fenofibrate followed by his PCP  Essential hypertension History of hypertension blood pressure measurements at 124/76. He is on metoprolol. Continue current meds at current dosing      Lorretta Harp MD [facility], [facility] 11/09/2015 4:21 PM

## 2015-11-09 NOTE — Assessment & Plan Note (Signed)
History of hypertension blood pressure measurements at 124/76. He is on metoprolol. Continue current meds at current dosing

## 2015-11-09 NOTE — Assessment & Plan Note (Signed)
History of hyperlipidemia on fenofibrate followed by his PCP 

## 2015-11-09 NOTE — Patient Instructions (Signed)

## 2016-01-01 DIAGNOSIS — H40033 Anatomical narrow angle, bilateral: Secondary | ICD-10-CM | POA: Diagnosis not present

## 2016-01-01 DIAGNOSIS — H2513 Age-related nuclear cataract, bilateral: Secondary | ICD-10-CM | POA: Diagnosis not present

## 2016-01-03 DIAGNOSIS — L400 Psoriasis vulgaris: Secondary | ICD-10-CM | POA: Diagnosis not present

## 2016-06-06 DIAGNOSIS — I10 Essential (primary) hypertension: Secondary | ICD-10-CM | POA: Diagnosis not present

## 2016-06-06 DIAGNOSIS — E781 Pure hyperglyceridemia: Secondary | ICD-10-CM | POA: Diagnosis not present

## 2016-06-20 ENCOUNTER — Encounter: Payer: Self-pay | Admitting: Cardiovascular Disease

## 2016-06-20 ENCOUNTER — Ambulatory Visit (INDEPENDENT_AMBULATORY_CARE_PROVIDER_SITE_OTHER): Payer: PPO | Admitting: Cardiovascular Disease

## 2016-06-20 VITALS — BP 118/74 | HR 70 | Ht 69.0 in | Wt 208.6 lb

## 2016-06-20 DIAGNOSIS — I471 Supraventricular tachycardia: Secondary | ICD-10-CM | POA: Diagnosis not present

## 2016-06-20 DIAGNOSIS — E785 Hyperlipidemia, unspecified: Secondary | ICD-10-CM

## 2016-06-20 DIAGNOSIS — E782 Mixed hyperlipidemia: Secondary | ICD-10-CM | POA: Diagnosis not present

## 2016-06-20 DIAGNOSIS — K219 Gastro-esophageal reflux disease without esophagitis: Secondary | ICD-10-CM | POA: Diagnosis not present

## 2016-06-20 DIAGNOSIS — I10 Essential (primary) hypertension: Secondary | ICD-10-CM | POA: Diagnosis not present

## 2016-06-20 DIAGNOSIS — E6609 Other obesity due to excess calories: Secondary | ICD-10-CM | POA: Diagnosis not present

## 2016-06-20 NOTE — Assessment & Plan Note (Signed)
History of hypertension blood pressure measures 118/74. He is on metoprolol. Continue current meds at current dosing

## 2016-06-20 NOTE — Patient Instructions (Signed)

## 2016-06-20 NOTE — Assessment & Plan Note (Signed)
History of hyperlipidemia on fenofibrate with recent lipid profile performed by his PCP 06/06/16 total cholesterol 150, LDL 100 and HDL of 37

## 2016-06-20 NOTE — Progress Notes (Signed)
     06/20/2016 IRL Randy [name]   [birth year]  [id number]  Primary Physician Stephens Shire, MD Primary Cardiologist: Lorretta Harp MD Renae Gloss  HPI:  The patient is a very pleasant, 70 year old, moderately overweight, married Caucasian male, father of 2 without I last saw in the office 11/09/15.Randy [name] He has risk factors that include hypertension, hyperlipidemia and family history. He had a Myoview stress test that was normal except for an abnormal electrocardiographic response to exercise and underwent MET-testing that was normal as well. He has been asymptomatic otherwise. Dr. Tollie Pizza follows his lipid profile.   Current Outpatient Prescriptions  Medication Sig Dispense Refill  . aspirin 81 MG tablet Take 81 mg by mouth daily.    . fenofibrate (TRICOR) 145 MG tablet Take 145 mg by mouth daily.    . fish oil-omega-3 fatty acids 1000 MG capsule Take 2 g by mouth daily.    . Flaxseed, Linseed, (FLAX SEEDS PO) Take 2 tablets by mouth every morning.     . metoprolol (LOPRESSOR) 100 MG tablet Take 50 mg by mouth 2 (two) times daily.    Randy [name] omeprazole (PRILOSEC) 20 MG capsule Take 20 mg by mouth daily.    Randy [name] triamcinolone cream (KENALOG) 0.1 % Apply 1 application topically 3 (three) times daily.     No current facility-administered medications for this visit.     No Known Allergies  Social History   Social History  . Marital status: Married    Spouse name: N/A  . Number of children: N/A  . Years of education: N/A   Occupational History  . Not on file.   Social History Main Topics  . Smoking status: Never Smoker  . Smokeless tobacco: Never Used  . Alcohol use No  . Drug use: No  . Sexual activity: Not on file   Other Topics Concern  . Not on file   Social History Narrative  . No narrative on file     Review of Systems: General: negative for chills, fever, night sweats or weight changes.  Cardiovascular: negative for chest pain, dyspnea on exertion, edema,  orthopnea, palpitations, paroxysmal nocturnal dyspnea or shortness of breath Dermatological: negative for rash Respiratory: negative for cough or wheezing Urologic: negative for hematuria Abdominal: negative for nausea, vomiting, diarrhea, bright red blood per rectum, melena, or hematemesis Neurologic: negative for visual changes, syncope, or dizziness All other systems reviewed and are otherwise negative except as noted above.    Blood pressure 118/74, pulse 70, height 5' 9" (1.753 m), weight 208 lb 9.6 oz (94.6 kg).  General appearance: alert and no distress Neck: no adenopathy, no carotid bruit, no JVD, supple, symmetrical, trachea midline and thyroid not enlarged, symmetric, no tenderness/mass/nodules Lungs: clear to auscultation bilaterally Heart: regular rate and rhythm, S1, S2 normal, no murmur, click, rub or gallop Extremities: extremities normal, atraumatic, no cyanosis or edema  EKG normal sinus rhythm at 70 without ST or T-wave changes. I personally reviewed this EKG  ASSESSMENT AND PLAN:   Essential hypertension History of hypertension blood pressure measures 118/74. He is on metoprolol. Continue current meds at current dosing  Hyperlipidemia History of hyperlipidemia on fenofibrate with recent lipid profile performed by his PCP 06/06/16 total cholesterol 150, LDL 100 and HDL of [place] MD [facility] 06/20/2016 4:41 PM

## 2016-10-04 DIAGNOSIS — I10 Essential (primary) hypertension: Secondary | ICD-10-CM | POA: Diagnosis not present

## 2016-10-04 DIAGNOSIS — L409 Psoriasis, unspecified: Secondary | ICD-10-CM | POA: Diagnosis not present

## 2016-10-04 DIAGNOSIS — E782 Mixed hyperlipidemia: Secondary | ICD-10-CM | POA: Diagnosis not present

## 2016-10-04 DIAGNOSIS — I471 Supraventricular tachycardia: Secondary | ICD-10-CM | POA: Diagnosis not present

## 2016-12-14 ENCOUNTER — Ambulatory Visit: Payer: Self-pay | Admitting: Family Medicine

## 2016-12-18 ENCOUNTER — Encounter: Payer: Self-pay | Admitting: Family Medicine

## 2016-12-18 ENCOUNTER — Ambulatory Visit (INDEPENDENT_AMBULATORY_CARE_PROVIDER_SITE_OTHER): Payer: PPO | Admitting: Family Medicine

## 2016-12-18 VITALS — BP 120/85 | HR 75 | Temp 97.3°F | Ht 69.0 in | Wt 212.0 lb

## 2016-12-18 DIAGNOSIS — E785 Hyperlipidemia, unspecified: Secondary | ICD-10-CM

## 2016-12-18 DIAGNOSIS — I10 Essential (primary) hypertension: Secondary | ICD-10-CM

## 2016-12-18 DIAGNOSIS — Z1211 Encounter for screening for malignant neoplasm of colon: Secondary | ICD-10-CM

## 2016-12-18 DIAGNOSIS — L409 Psoriasis, unspecified: Secondary | ICD-10-CM | POA: Diagnosis not present

## 2016-12-18 DIAGNOSIS — H6123 Impacted cerumen, bilateral: Secondary | ICD-10-CM | POA: Diagnosis not present

## 2016-12-18 MED ORDER — METOPROLOL TARTRATE 100 MG PO TABS: 50.0000 mg | ORAL_TABLET | Freq: Two times a day (BID) | ORAL | 3 refills | Status: DC

## 2016-12-18 MED ORDER — FENOFIBRATE 145 MG PO TABS
145.0000 mg | ORAL_TABLET | Freq: Every day | ORAL | 3 refills | Status: DC
Start: 2016-12-18 — End: 2018-01-06

## 2016-12-18 NOTE — Progress Notes (Signed)
   HPI  Patient presents today to establish care with rheumatoid impaction, hyperlipidemia, hypertension.  Hyperlipidemia Patient taking TriCor without a problem for many years. Describes hyperlipidemia, unknown triglycerides.  Hypertension Good medication compliance, no chest pain.  Patient reports decreased hearing for several weeks, uses Q-tips.  Patient reports previous colonoscopy was normal.   PMH: Smoking status noted- smoker, history of paroxysmal SVT, hypertension, psoriasis, hyperlipidemia. Surgical history positive for colonoscopy, hip arthroplasty, lumbar laminectomy, stress Myoview Family history positive for artery disease in father. Social history: No alcohol or drug use ROS: Per HPI  Objective: BP 120/85   Pulse 75   Temp 97.3 F (36.3 C) (Oral)   Ht '5\' 9"'$  (1.753 m)   Wt 212 lb (96.2 kg)   BMI 31.31 kg/m  Gen: NAD, alert, cooperative with exam HEENT: NCAT, EOMI, PERRL CV: RRR, good S1/S2, no murmur Resp: CTABL, no wheezes, non-labored Abd: SNTND, BS present, no guarding or organomegaly Ext: No edema, warm Neuro: Alert and oriented, 1 plus patellar tendon reflexes bilaterally Skin: Scaling of the skin on bilateral hands  Assessment and plan:  # Hypertension Well-controlled on metoprolol Refilled, continue  # GERD Well-controlled on 20 mg Prilosec, no changes  # Hearing loss with bilateral cerumen impaction Irrigation performed in clcinic today with good effect-   # Hyperlipidemia Fasting labs today Continue TriCor  Screening for colon cancer, history of hyperplastic polyp Refer to GI, Dr. Fuller Plan at [place] This Encounter  Procedures  . Lipid panel  . CMP14+EGFR  . CBC with Differential/Platelet  . Ambulatory referral to Gastroenterology    Referral Priority:   Routine    Referral Type:   Consultation    Referral Reason:   Specialty Services Required    Number of Visits Requested:   1    Meds ordered this encounter   Medications  . fenofibrate (TRICOR) 145 MG tablet    Sig: Take 1 tablet (145 mg total) by mouth daily.    Dispense:  90 tablet    Refill:  3  . metoprolol (LOPRESSOR) 100 MG tablet    Sig: Take 0.5 tablets (50 mg total) by mouth 2 (two) times daily.    Dispense:  45 tablet    Refill:  [place], MD [place] 12/18/2016, 8:54 AM

## 2016-12-18 NOTE — Patient Instructions (Signed)
Great to meet you!  Lets see you again in 3 months  We will send your labs on mychart or call within 1 week  I have refilled your medicines, they were sent directly to the pharmacy.   You will be called to schedule an appointment with GI for a colonoscopy

## 2016-12-19 LAB — CBC WITH DIFFERENTIAL/PLATELET
BASOS ABS: 0 10*3/uL (ref 0.0–0.2)
BASOS: 0 %
EOS (ABSOLUTE): 0.1 10*3/uL (ref 0.0–0.4)
Eos: 2 %
HEMOGLOBIN: 16.1 g/dL (ref 13.0–17.7)
Hematocrit: 48 % (ref 37.5–51.0)
IMMATURE GRANS (ABS): 0 10*3/uL (ref 0.0–0.1)
IMMATURE GRANULOCYTES: 0 %
LYMPHS: 31 %
Lymphocytes Absolute: 1.6 10*3/uL (ref 0.7–3.1)
MCH: 31.1 pg (ref 26.6–33.0)
MCHC: 33.5 g/dL (ref 31.5–35.7)
MCV: 93 fL (ref 79–97)
MONOCYTES: 8 %
Monocytes Absolute: 0.4 10*3/uL (ref 0.1–0.9)
NEUTROS PCT: 59 %
Neutrophils Absolute: 3.1 10*3/uL (ref 1.4–7.0)
PLATELETS: 212 10*3/uL (ref 150–379)
RBC: 5.18 x10E6/uL (ref 4.14–5.80)
RDW: 13.8 % (ref 12.3–15.4)
WBC: 5.2 10*3/uL (ref 3.4–10.8)

## 2016-12-19 LAB — CMP14+EGFR
ALT: 22 IU/L (ref 0–44)
AST: 25 IU/L (ref 0–40)
Albumin/Globulin Ratio: 1.6 (ref 1.2–2.2)
Albumin: 4.2 g/dL (ref 3.5–4.8)
Alkaline Phosphatase: 98 IU/L (ref 39–117)
BUN/Creatinine Ratio: 13 (ref 10–24)
BUN: 15 mg/dL (ref 8–27)
Bilirubin Total: 0.6 mg/dL (ref 0.0–1.2)
CHLORIDE: 100 mmol/L (ref 96–106)
CO2: 24 mmol/L (ref 18–29)
Calcium: 9.6 mg/dL (ref 8.6–10.2)
Creatinine, Ser: 1.12 mg/dL (ref 0.76–1.27)
GFR, EST AFRICAN AMERICAN: 77 mL/min/{1.73_m2} (ref >59)
GFR, EST NON AFRICAN AMERICAN: 66 mL/min/{1.73_m2} (ref >59)
Globulin, Total: 2.7 g/dL (ref 1.5–4.5)
Glucose: 86 mg/dL (ref 65–99)
Potassium: 4.8 mmol/L (ref 3.5–5.2)
Sodium: 140 mmol/L (ref 134–144)
TOTAL PROTEIN: 6.9 g/dL (ref 6.0–8.5)

## 2016-12-19 LAB — LIPID PANEL
CHOLESTEROL TOTAL: 154 mg/dL (ref 100–199)
Chol/HDL Ratio: 4.5 ratio units (ref 0.0–5.0)
HDL: 34 mg/dL — AB (ref >39)
LDL CALC: 92 mg/dL (ref 0–99)
Triglycerides: 140 mg/dL (ref 0–149)
VLDL CHOLESTEROL CAL: 28 mg/dL (ref 5–40)

## 2017-01-21 ENCOUNTER — Emergency Department (HOSPITAL_COMMUNITY): Payer: PPO

## 2017-01-21 ENCOUNTER — Encounter (HOSPITAL_COMMUNITY): Payer: Self-pay | Admitting: Emergency Medicine

## 2017-01-21 ENCOUNTER — Emergency Department (HOSPITAL_COMMUNITY)
Admission: EM | Admit: 2017-01-21 | Discharge: 2017-01-21 | Disposition: A | Discharge status: Home / Self Care | Payer: PPO | Attending: Emergency Medicine | Admitting: Emergency Medicine

## 2017-01-21 DIAGNOSIS — I10 Essential (primary) hypertension: Secondary | ICD-10-CM | POA: Diagnosis not present

## 2017-01-21 DIAGNOSIS — R112 Nausea with vomiting, unspecified: Secondary | ICD-10-CM

## 2017-01-21 DIAGNOSIS — R079 Chest pain, unspecified: Secondary | ICD-10-CM | POA: Diagnosis not present

## 2017-01-21 DIAGNOSIS — R1110 Vomiting, unspecified: Secondary | ICD-10-CM

## 2017-01-21 DIAGNOSIS — Z7982 Long term (current) use of aspirin: Secondary | ICD-10-CM | POA: Insufficient documentation

## 2017-01-21 LAB — I-STAT TROPONIN, ED
TROPONIN I, POC: 0 ng/mL (ref 0.00–0.08)
Troponin i, poc: 0 ng/mL (ref 0.00–0.08)

## 2017-01-21 LAB — HEPATIC FUNCTION PANEL
ALK PHOS: 64 U/L (ref 38–126)
ALT: 20 U/L (ref 17–63)
AST: 22 U/L (ref 15–41)
Albumin: 3.5 g/dL (ref 3.5–5.0)
BILIRUBIN DIRECT: 0.6 mg/dL — AB (ref 0.1–0.5)
BILIRUBIN INDIRECT: 1.1 mg/dL — AB (ref 0.3–0.9)
Total Bilirubin: 1.7 mg/dL — ABNORMAL HIGH (ref 0.3–1.2)
Total Protein: 7.1 g/dL (ref 6.5–8.1)

## 2017-01-21 LAB — URINALYSIS, ROUTINE W REFLEX MICROSCOPIC
Bilirubin Urine: NEGATIVE
GLUCOSE, UA: NEGATIVE mg/dL
HGB URINE DIPSTICK: NEGATIVE
Ketones, ur: NEGATIVE mg/dL
NITRITE: NEGATIVE
Protein, ur: 30 mg/dL — AB
SPECIFIC GRAVITY, URINE: 1.025 (ref 1.005–1.030)
pH: 5 (ref 5.0–8.0)

## 2017-01-21 LAB — BASIC METABOLIC PANEL
ANION GAP: 13 (ref 5–15)
BUN: 14 mg/dL (ref 6–20)
CO2: 18 mmol/L — ABNORMAL LOW (ref 22–32)
Calcium: 9.8 mg/dL (ref 8.9–10.3)
Chloride: 105 mmol/L (ref 101–111)
Creatinine, Ser: 1.03 mg/dL (ref 0.61–1.24)
GLUCOSE: 122 mg/dL — AB (ref 65–99)
POTASSIUM: 3.9 mmol/L (ref 3.5–5.1)
SODIUM: 136 mmol/L (ref 135–145)

## 2017-01-21 LAB — CBC
HEMATOCRIT: 46.7 % (ref 39.0–52.0)
HEMOGLOBIN: 16.5 g/dL (ref 13.0–17.0)
MCH: 32 pg (ref 26.0–34.0)
MCHC: 35.3 g/dL (ref 30.0–36.0)
MCV: 90.5 fL (ref 78.0–100.0)
Platelets: 176 10*3/uL (ref 150–400)
RBC: 5.16 MIL/uL (ref 4.22–5.81)
RDW: 12.7 % (ref 11.5–15.5)
WBC: 15 10*3/uL — AB (ref 4.0–10.5)

## 2017-01-21 LAB — LIPASE, BLOOD: Lipase: 17 U/L (ref 11–51)

## 2017-01-21 MED ORDER — ONDANSETRON 4 MG PO TBDP: 4.0000 mg | ORAL_TABLET | Freq: Three times a day (TID) | ORAL | 0 refills | Status: DC | PRN

## 2017-01-21 NOTE — ED Notes (Signed)
Patient transported to X-ray 

## 2017-01-21 NOTE — ED Triage Notes (Signed)
Pt sts N/V x 2 days and some mid sternal CP with possible radiation to left arm today

## 2017-01-21 NOTE — ED Notes (Signed)
Patient transported to Ultrasound 
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
## 2017-02-21 ENCOUNTER — Ambulatory Visit (INDEPENDENT_AMBULATORY_CARE_PROVIDER_SITE_OTHER): Payer: PPO | Admitting: Physician Assistant

## 2017-02-21 ENCOUNTER — Encounter: Payer: Self-pay | Admitting: Physician Assistant

## 2017-02-21 ENCOUNTER — Ambulatory Visit (INDEPENDENT_AMBULATORY_CARE_PROVIDER_SITE_OTHER): Payer: PPO

## 2017-02-21 VITALS — BP 123/77 | HR 68 | Temp 97.0°F | Ht 70.0 in

## 2017-02-21 DIAGNOSIS — R0781 Pleurodynia: Secondary | ICD-10-CM | POA: Diagnosis not present

## 2017-02-21 DIAGNOSIS — S2242XA Multiple fractures of ribs, left side, initial encounter for closed fracture: Secondary | ICD-10-CM

## 2017-02-21 MED ORDER — NAPROXEN 375 MG PO TABS: 375.0000 mg | ORAL_TABLET | Freq: Two times a day (BID) | ORAL | 2 refills | Status: DC

## 2017-02-21 MED ORDER — TRAMADOL HCL 50 MG PO TABS: 50.0000 mg | ORAL_TABLET | Freq: Three times a day (TID) | ORAL | 0 refills | Status: DC | PRN
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
## 2017-02-21 NOTE — Patient Instructions (Signed)
Rib Fracture A rib fracture is a break or crack in one of the bones of the ribs. The ribs are like a cage that goes around your upper chest. A broken or cracked rib is often painful, but most do not cause other problems. Most rib fractures heal on their own in 1-3 months. Follow these instructions at home:  Avoid activities that cause pain to the injured area. Protect your injured area.  Slowly increase activity as told by your doctor.  Take medicine as told by your doctor.  Put ice on the injured area for the first 1-2 days after you have been treated or as told by your doctor.  Put ice in a plastic bag.  Place a towel between your skin and the bag.  Leave the ice on for 15-20 minutes at a time, every 2 hours while you are awake.  Do deep breathing as told by your doctor. You may be told to:  Take deep breaths many times a day.  Cough many times a day while hugging a pillow.  Use a device (incentive spirometer) to perform deep breathing many times a day.  Drink enough fluids to keep your pee (urine) clear or pale yellow.  Do not wear a rib belt or binder. These do not allow you to breathe deeply. Get help right away if:  You have a fever.  You have trouble breathing.  You cannot stop coughing.  You cough up thick or bloody spit (mucus).  You feel sick to your stomach (nauseous), throw up (vomit), or have belly (abdominal) pain.  Your pain gets worse and medicine does not help. This information is not intended to replace advice given to you by your health care provider. Make sure you discuss any questions you have with your health care provider. Document Released: 08/22/2008 Document Revised: 04/20/2016 Document Reviewed: 01/15/2013 Elsevier Interactive Patient Education  2017 Elsevier Inc.  

## 2017-03-27 ENCOUNTER — Ambulatory Visit (INDEPENDENT_AMBULATORY_CARE_PROVIDER_SITE_OTHER): Payer: PPO

## 2017-03-27 ENCOUNTER — Encounter: Payer: Self-pay | Admitting: Physician Assistant

## 2017-03-27 ENCOUNTER — Ambulatory Visit (INDEPENDENT_AMBULATORY_CARE_PROVIDER_SITE_OTHER): Payer: PPO | Admitting: Physician Assistant

## 2017-03-27 VITALS — Ht 70.0 in | Wt 211.8 lb

## 2017-03-27 DIAGNOSIS — Z8781 Personal history of (healed) traumatic fracture: Secondary | ICD-10-CM | POA: Diagnosis not present

## 2017-03-27 DIAGNOSIS — S2242XD Multiple fractures of ribs, left side, subsequent encounter for fracture with routine healing: Secondary | ICD-10-CM

## 2017-03-27 DIAGNOSIS — S2242XA Multiple fractures of ribs, left side, initial encounter for closed fracture: Secondary | ICD-10-CM | POA: Diagnosis not present
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
## 2017-03-27 NOTE — Patient Instructions (Signed)
In a few days you may receive a survey in the mail or online from [employer] regarding your visit with us today. Please take a moment to fill this out. Your feedback is very important to our whole office. It can help us better understand your needs as well as improve your experience and satisfaction. Thank you for taking your time to complete it. We care about you.  Leylany Nored, PA-C  

## 2017-06-20 ENCOUNTER — Other Ambulatory Visit: Payer: Self-pay | Admitting: Physician Assistant

## 2017-06-20 DIAGNOSIS — S2242XA Multiple fractures of ribs, left side, initial encounter for closed fracture: Secondary | ICD-10-CM

## 2017-06-20 DIAGNOSIS — R0781 Pleurodynia: Secondary | ICD-10-CM

## 2017-10-01 ENCOUNTER — Encounter: Payer: Self-pay | Admitting: Family Medicine

## 2017-10-01 ENCOUNTER — Ambulatory Visit (INDEPENDENT_AMBULATORY_CARE_PROVIDER_SITE_OTHER): Payer: PPO | Admitting: Family Medicine

## 2017-10-01 VITALS — BP 134/81 | HR 74 | Temp 97.5°F | Ht 70.0 in | Wt 207.0 lb

## 2017-10-01 DIAGNOSIS — L409 Psoriasis, unspecified: Secondary | ICD-10-CM

## 2017-10-01 DIAGNOSIS — K625 Hemorrhage of anus and rectum: Secondary | ICD-10-CM | POA: Diagnosis not present

## 2017-10-01 MED ORDER — CLOBETASOL PROPIONATE 0.05 % EX OINT: 1.0000 "application " | TOPICAL_OINTMENT | Freq: Two times a day (BID) | CUTANEOUS | 2 refills | Status: DC

## 2017-10-01 NOTE — Progress Notes (Signed)
   HPI  Patient presents today here for rectal bleeding.  Patient explains her last 2 days he has had small to moderate amount of blood on the toilet paper after wiping.  He denies any blood on the stool or in the toilet water.  He states that this was worse at first and is improving. He has been straining at stool for the last 3-4 months.  He complains of constipation with one stool daily.  He has not tried stool softeners, however his wife states that they have bought some to try.  He also has psoriasis, he is tried multiple medications for this and had side effects to Humira. He states that symptoms are worse in the cold weather. He does use Vaseline for his fingernails.  PMH: Smoking status noted ROS: Per HPI  Objective: BP 134/81   Pulse 74   Temp (!) 97.5 F (36.4 C) (Oral)   Ht _0  (1.778 m)   Wt 207 lb (93.9 kg)   BMI 29.70 kg/m  Gen: NAD, alert, cooperative with exam HEENT: NCAT CV: RRR, good S1/S2, no murmur Resp: CTABL, no wheezes, non-labored Ext: No edema, warm Neuro: Alert and oriented, No gross deficits Rectal: Normal rectal tone, small swelling at 9:00 consistent with small hemorrhoid, however no signs of bleeding, small amount of blood flecks seen after exam Prostate overall normal to palpation with good symmetry and normal texture with grossly  normal size.  Skin Bilateral hands with erythema and silvery scale from MCPs on, bilateral upper calves with characteristic lesion measuring 4 cm in diameter, also lower extremity on the anterior surface of the lower leg with erythema and scaling of the skin.  Assessment and plan:  #Rectal bleeding Small amount of rectal bleeding on the toilet paper, likely hemorrhoids due to straining at stool. Discussed stool softeners Offered GI referral today, however patient would like to watch and wait as he is improving. CBC Discussed red flags for seeking emergent medical care or calling back  #Psoriasis Widespread,  patient has had treatment with DMARDs in the past. Clobetasol ointment given     Orders Placed This Encounter  Procedures  . CBC with Differential/Platelet  . CMP14+EGFR    Meds ordered this encounter  Medications  . clobetasol ointment (TEMOVATE) 0.05 %    Sig: Apply 1 application 2 (two) times daily topically.    Dispense:  60 g    Refill:  [place], MD [facility] 10/01/2017, 11:46 AM

## 2017-10-01 NOTE — Patient Instructions (Signed)
Great to see you!  Purulent if your bleeding becomes persistent, increases, or becomes painful please inform me.  If you have sudden heavy rectal bleeding please seek emergency medical attention.  If you develop a feeling of faintness, dizziness, or generalized weakness please seek medical attention.  I have also sent clobetasol ointment for your psoriasis, if you begin to use this continuously please give yourself a break for 3-4 days every 2-3 weeks.

## 2017-10-02 LAB — CBC WITH DIFFERENTIAL/PLATELET
Basophils Absolute: 0 10*3/uL (ref 0.0–0.2)
Basos: 1 %
EOS (ABSOLUTE): 0.2 10*3/uL (ref 0.0–0.4)
EOS: 3 %
HEMATOCRIT: 48.4 % (ref 37.5–51.0)
HEMOGLOBIN: 16.1 g/dL (ref 13.0–17.7)
Immature Grans (Abs): 0 10*3/uL (ref 0.0–0.1)
Immature Granulocytes: 0 %
LYMPHS ABS: 1.9 10*3/uL (ref 0.7–3.1)
Lymphs: 28 %
MCH: 31.3 pg (ref 26.6–33.0)
MCHC: 33.3 g/dL (ref 31.5–35.7)
MCV: 94 fL (ref 79–97)
MONOCYTES: 7 %
MONOS ABS: 0.5 10*3/uL (ref 0.1–0.9)
NEUTROS ABS: 4.1 10*3/uL (ref 1.4–7.0)
Neutrophils: 61 %
Platelets: 256 10*3/uL (ref 150–379)
RBC: 5.14 x10E6/uL (ref 4.14–5.80)
RDW: 14 % (ref 12.3–15.4)
WBC: 6.8 10*3/uL (ref 3.4–10.8)

## 2017-10-02 LAB — CMP14+EGFR
A/G RATIO: 1.5 (ref 1.2–2.2)
ALBUMIN: 4.3 g/dL (ref 3.5–4.8)
ALK PHOS: 112 IU/L (ref 39–117)
ALT: 25 IU/L (ref 0–44)
AST: 28 IU/L (ref 0–40)
BILIRUBIN TOTAL: 0.5 mg/dL (ref 0.0–1.2)
BUN / CREAT RATIO: 16 (ref 10–24)
BUN: 15 mg/dL (ref 8–27)
CHLORIDE: 102 mmol/L (ref 96–106)
CO2: 24 mmol/L (ref 20–29)
Calcium: 9.7 mg/dL (ref 8.6–10.2)
Creatinine, Ser: 0.93 mg/dL (ref 0.76–1.27)
GFR calc Af Amer: 95 mL/min/{1.73_m2} (ref >59)
GFR calc non Af Amer: 82 mL/min/{1.73_m2} (ref >59)
GLOBULIN, TOTAL: 2.8 g/dL (ref 1.5–4.5)
Glucose: 77 mg/dL (ref 65–99)
POTASSIUM: 4.7 mmol/L (ref 3.5–5.2)
SODIUM: 140 mmol/L (ref 134–144)
Total Protein: 7.1 g/dL (ref 6.0–8.5)

## 2017-12-14 ENCOUNTER — Telehealth: Payer: Self-pay | Admitting: Family Medicine

## 2017-12-14 NOTE — Telephone Encounter (Signed)
Please advise on referral or will he need to do office visit first.

## 2017-12-14 NOTE — Telephone Encounter (Signed)
I am glad to refer him but I need to evaluate the tremor ( likely) before I can refer.   Murtis SinkSam Chidera Dearcos, MD [facility] 12/14/2017, 5:30 PM

## 2017-12-17 NOTE — Telephone Encounter (Signed)
Pt given appt with Dr Ermalinda MemosBradshaw 12/19/17 at 3:55.

## 2017-12-19 ENCOUNTER — Ambulatory Visit (INDEPENDENT_AMBULATORY_CARE_PROVIDER_SITE_OTHER): Payer: PPO | Admitting: Family Medicine

## 2017-12-19 ENCOUNTER — Encounter: Payer: Self-pay | Admitting: Family Medicine

## 2017-12-19 VITALS — BP 123/76 | HR 68 | Temp 97.0°F | Ht 70.0 in | Wt 205.8 lb

## 2017-12-19 DIAGNOSIS — R251 Tremor, unspecified: Secondary | ICD-10-CM | POA: Diagnosis not present

## 2017-12-19 DIAGNOSIS — E785 Hyperlipidemia, unspecified: Secondary | ICD-10-CM

## 2017-12-19 DIAGNOSIS — L409 Psoriasis, unspecified: Secondary | ICD-10-CM | POA: Diagnosis not present

## 2017-12-19 NOTE — Progress Notes (Signed)
   HPI  Patient presents today here for hand tremors.  Patient and his wife explained that for the last 2-3 months he has had a right hand tremor that happens when he hangs his hand by his side, she also explains that he has bilateral foot tremors whenever he is sitting in the recliner at night.  She said that it is so violent that it seems like he is shaking the entire recliner.  They are interested in getting a full evaluation with neurology.  PMH: Smoking status noted ROS: Per HPI  Objective: BP 123/76   Pulse 68   Temp (!) 97 F (36.1 C) (Oral)   Ht 5\' 10"  (1.778 m)   Wt 205 lb 12.8 oz (93.4 kg)   BMI 29.53 kg/m  Gen: NAD, alert, cooperative with exam HEENT: NCAT CV: RRR, good S1/S2, no murmur Resp: CTABL, no wheezes, non-labored Ext: No edema, warm Neuro: Alert and oriented, fine tremor observed on the right hand which is very mild with holding hands extended in front of him  Skin:  Diffuse erythema and silvery scale on bilateral hands  Assessment and plan:  #Tremor New onset last 2-3 months, primarily right hand and his feet at night. Refer to neurology given unilateral tremor with new onset, very mild on exam  #Psoriasis Discussed the clobetasol ointment, he has not tried it He will consider, discussed using only on hands  #Hyperlipidemia Nonfasting labs today, tolerating medication well    Orders Placed This Encounter  Procedures  . Lipid panel  . Ambulatory referral to Neurology    Referral Priority:   Routine    Referral Type:   Consultation    Referral Reason:   Specialty Services Required    Requested Specialty:   Neurology    Number of Visits Requested:   1     Murtis SinkSam Bradshaw, MD [facility] 12/19/2017, 4:09 PM

## 2017-12-19 NOTE — Patient Instructions (Signed)
Great to see you!  Come back in 3-4 months unless you need us sooner.    

## 2017-12-20 LAB — LIPID PANEL
CHOLESTEROL TOTAL: 132 mg/dL (ref 100–199)
Chol/HDL Ratio: 6.3 ratio — ABNORMAL HIGH (ref 0.0–5.0)
HDL: 21 mg/dL — AB (ref >39)
LDL Calculated: 48 mg/dL (ref 0–99)
TRIGLYCERIDES: 313 mg/dL — AB (ref 0–149)
VLDL Cholesterol Cal: 63 mg/dL — ABNORMAL HIGH (ref 5–40)

## 2018-01-06 ENCOUNTER — Other Ambulatory Visit: Payer: Self-pay | Admitting: Family Medicine

## 2018-01-18 ENCOUNTER — Other Ambulatory Visit: Payer: Self-pay | Admitting: Family Medicine

## 2018-02-14 ENCOUNTER — Telehealth: Payer: Self-pay | Admitting: Neurology

## 2018-02-14 ENCOUNTER — Ambulatory Visit (INDEPENDENT_AMBULATORY_CARE_PROVIDER_SITE_OTHER): Payer: PPO | Admitting: Neurology

## 2018-02-14 ENCOUNTER — Encounter: Payer: Self-pay | Admitting: Neurology

## 2018-02-14 ENCOUNTER — Other Ambulatory Visit: Payer: Self-pay

## 2018-02-14 VITALS — BP 111/54 | HR 62 | Ht 70.0 in | Wt 211.0 lb

## 2018-02-14 DIAGNOSIS — R251 Tremor, unspecified: Secondary | ICD-10-CM

## 2018-02-14 DIAGNOSIS — G20 Parkinson's disease: Secondary | ICD-10-CM

## 2018-02-14 DIAGNOSIS — G3281 Cerebellar ataxia in diseases classified elsewhere: Secondary | ICD-10-CM

## 2018-02-14 DIAGNOSIS — G20A1 Parkinson's disease without dyskinesia, without mention of fluctuations: Secondary | ICD-10-CM | POA: Insufficient documentation

## 2018-02-14 HISTORY — DX: Parkinson's disease: G20

## 2018-02-14 HISTORY — DX: Parkinson's disease without dyskinesia, without mention of fluctuations: G20.A1

## 2018-02-14 NOTE — Patient Instructions (Signed)
   We will get a CT of the head and follow along for the tremor.

## 2018-02-14 NOTE — Telephone Encounter (Signed)
health team faxed clinical notes order sent to GI they will reach out to the patient to schedule.

## 2018-02-14 NOTE — Telephone Encounter (Signed)
Health team auth: [id number] (exp. 02/14/18 to 05/15/18)
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
## 2018-02-27 ENCOUNTER — Ambulatory Visit
Admission: RE | Admit: 2018-02-27 | Discharge: 2018-02-27 | Disposition: A | Discharge status: Home / Self Care | Payer: PPO | Source: Ambulatory Visit | Attending: Neurology | Admitting: Neurology

## 2018-02-27 DIAGNOSIS — R251 Tremor, unspecified: Secondary | ICD-10-CM | POA: Diagnosis not present

## 2018-02-27 DIAGNOSIS — R270 Ataxia, unspecified: Secondary | ICD-10-CM | POA: Diagnosis not present

## 2018-02-28 ENCOUNTER — Telehealth: Payer: Self-pay | Admitting: Neurology

## 2018-02-28 NOTE — Telephone Encounter (Signed)
I called the patient. The CT of the brain is normal. We will follow for the tremor issue.  CT head 02/28/18:  IMPRESSION:   Unremarkable CT head (without). No acute findings.

## 2018-03-14 IMAGING — CR DG CHEST 2V
2 series · 2 of 2 positions shown · non-contrast
Comparison: Chest radiograph January 15, 2013

CLINICAL DATA: LEFT arm numbness, diaphoresis, nausea, vomiting,
diarrhea and chest pain beginning yesterday.

EXAM:
CHEST  2 VIEW

[chest pa]
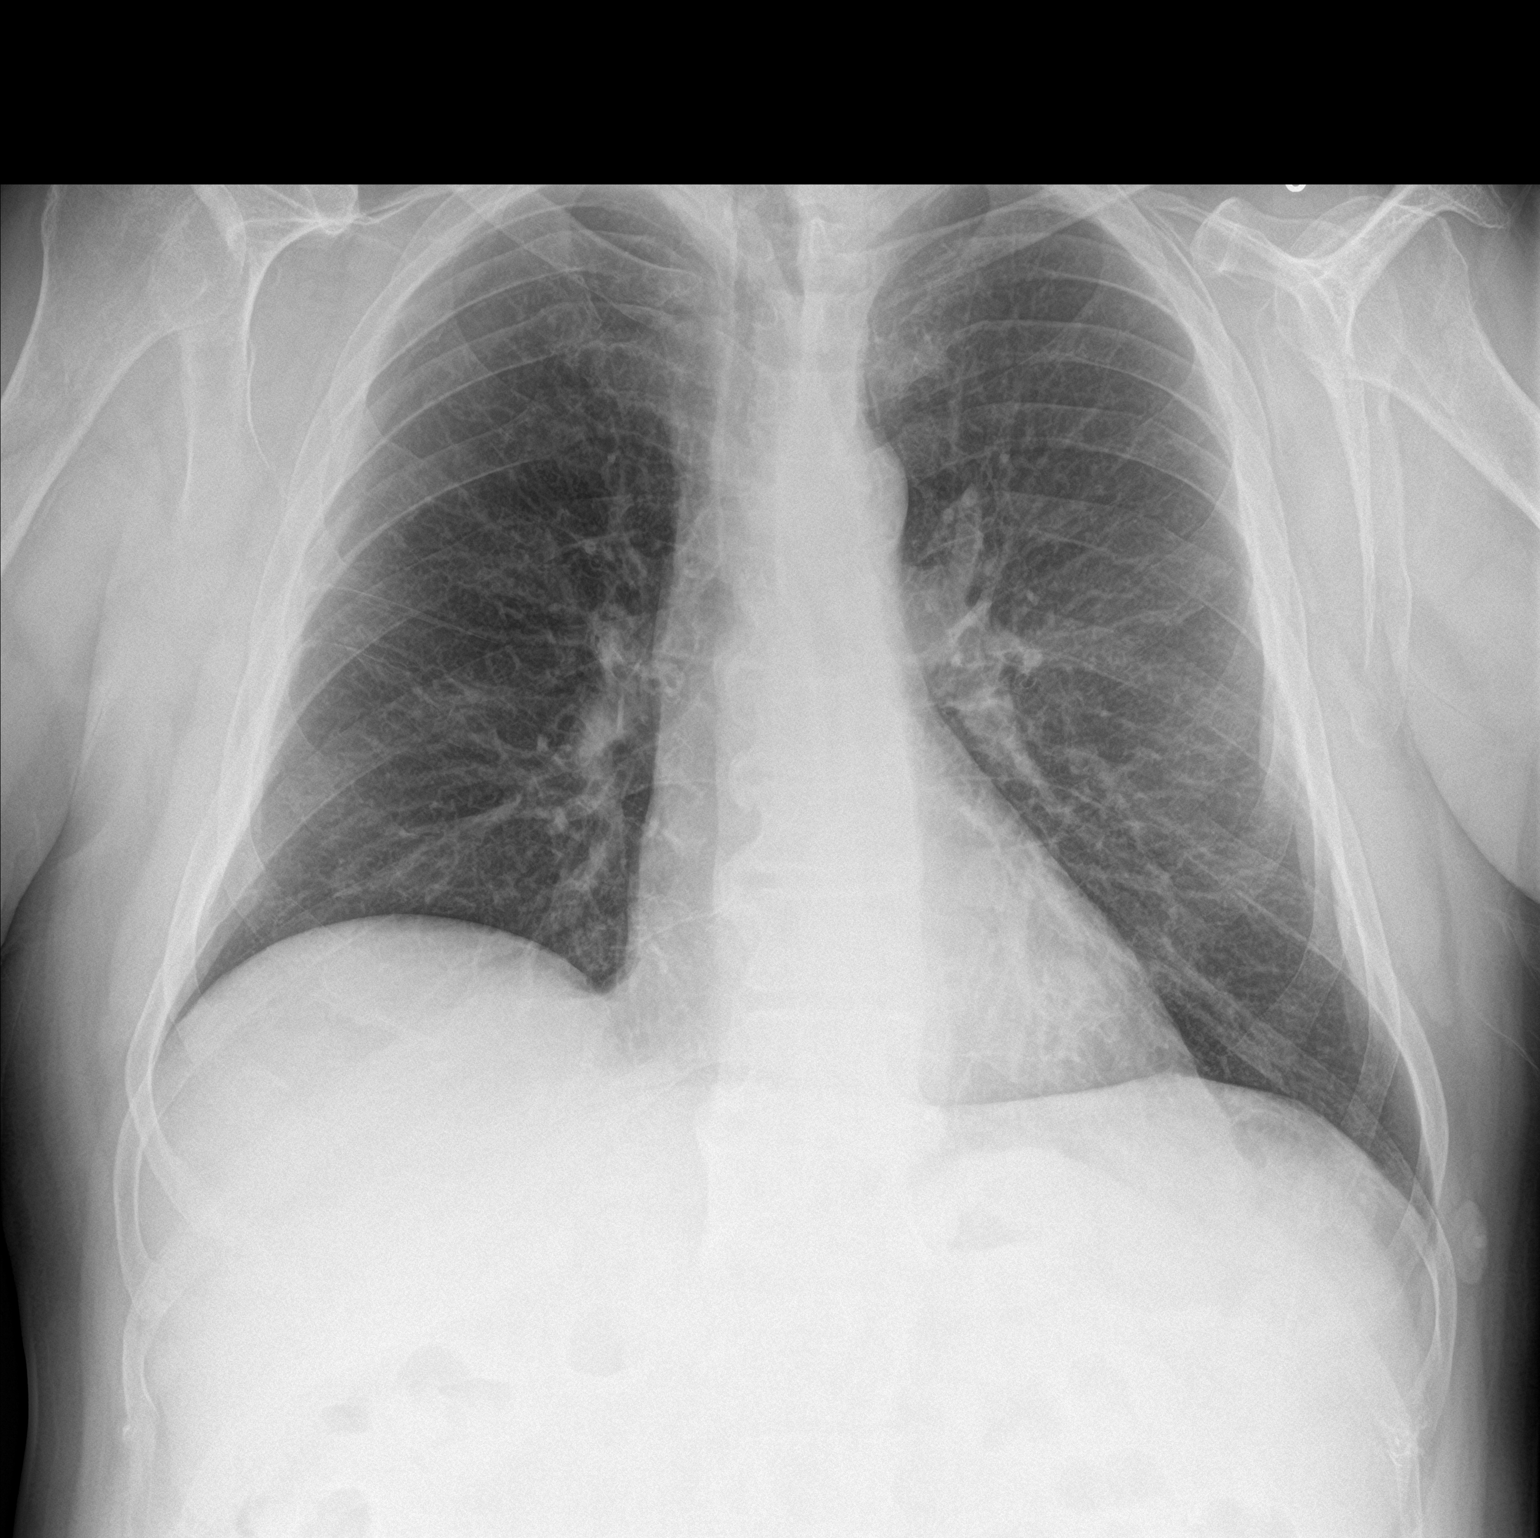

[chest lat]
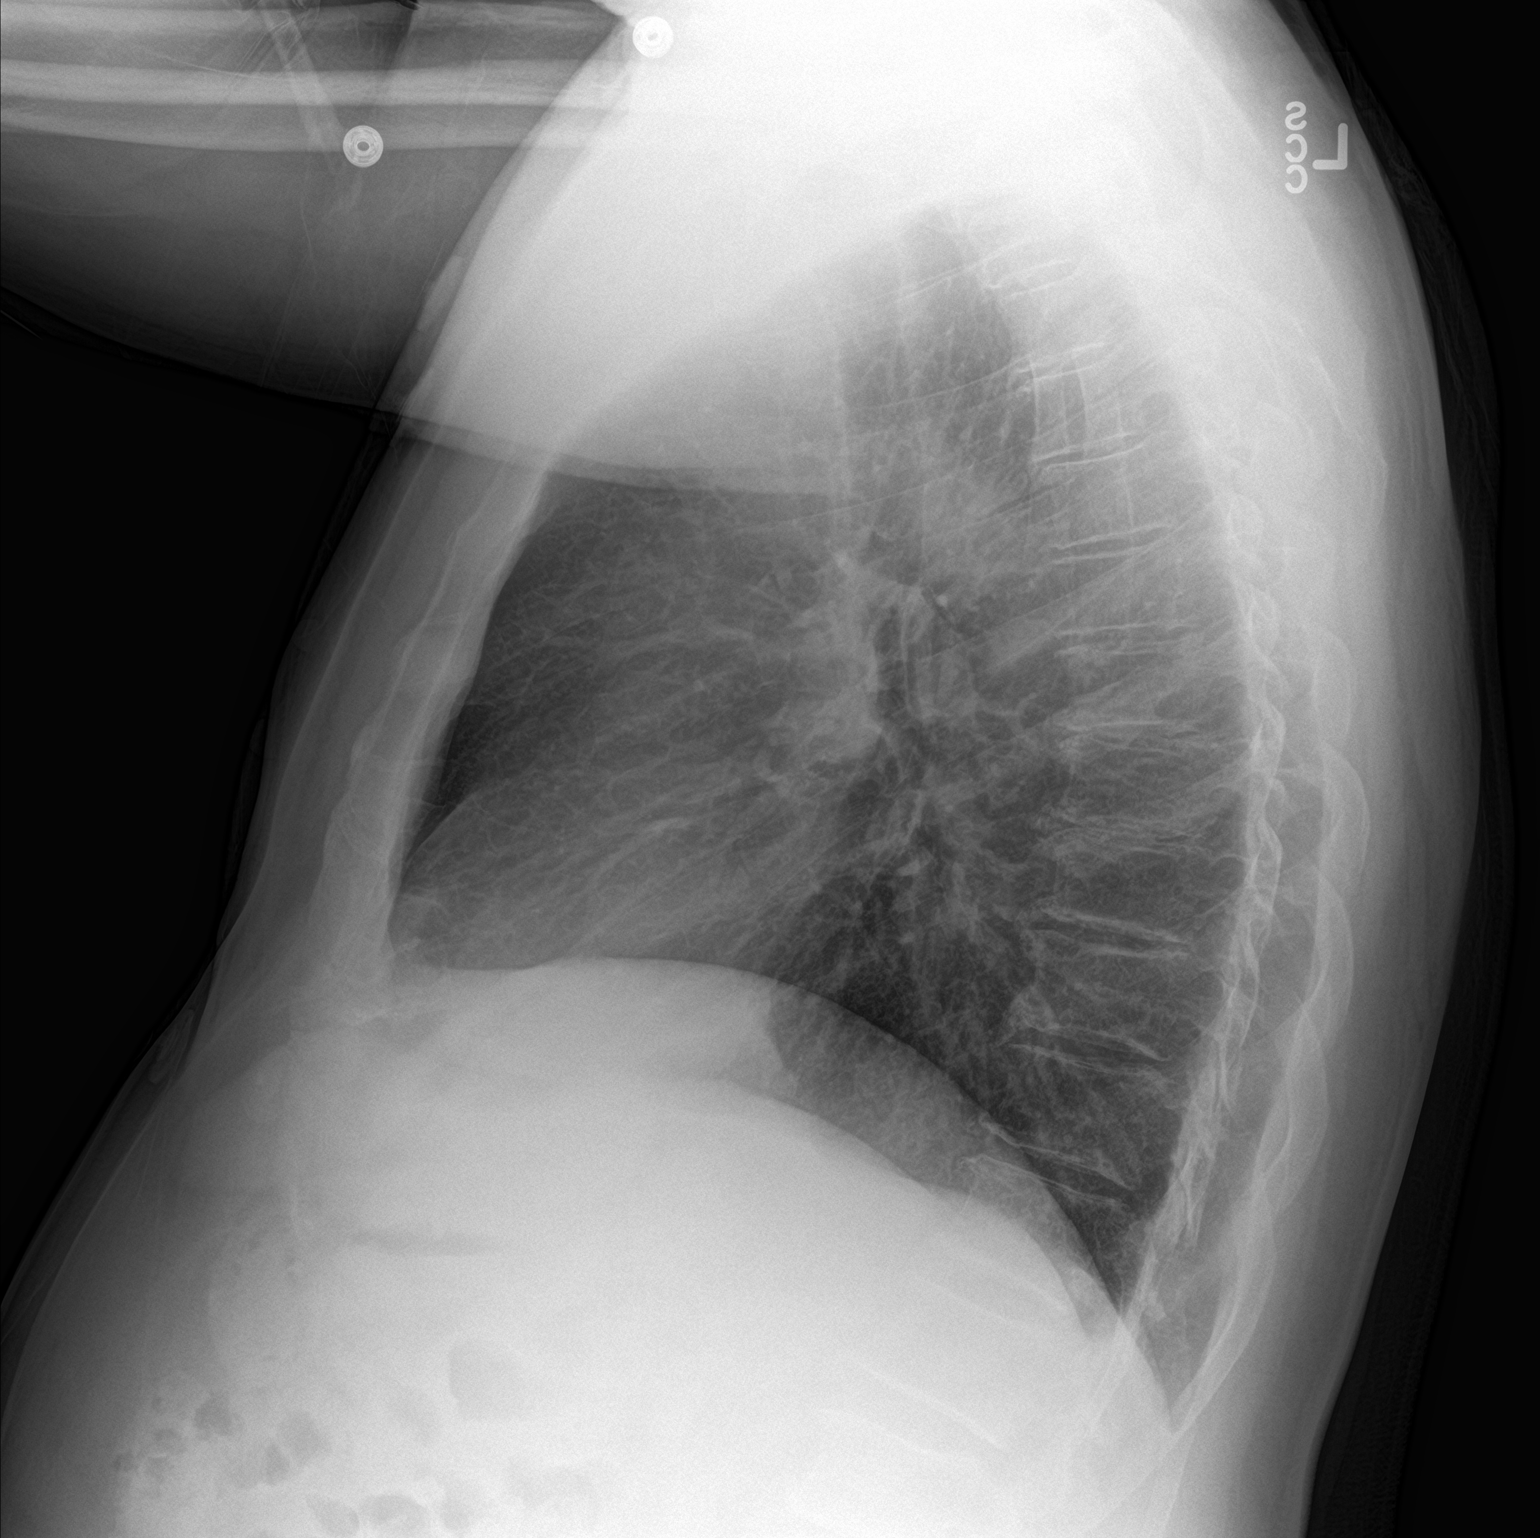

[2 of 2 positions shown; findings below may reference images not displayed]

FINDINGS: Cardiomediastinal silhouette is normal. No pleural effusions or
focal consolidations. Mild chronic bronchitic changes. Trachea
projects midline and there is no pneumothorax. Soft tissue planes
and included osseous structures are non-suspicious. Mild
degenerative change of the thoracic spine.
IMPRESSION: Mild chronic bronchitic changes without focal consolidation.
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
## 2018-07-12 ENCOUNTER — Other Ambulatory Visit: Payer: Self-pay

## 2018-07-12 MED ORDER — FENOFIBRATE 145 MG PO TABS: 145.0000 mg | ORAL_TABLET | Freq: Every day | ORAL | 0 refills | Status: DC

## 2018-07-12 NOTE — Telephone Encounter (Signed)
Authorize 30 days only. Then contact the patient letting them know that they will need an appointment before any further prescriptions can be sent in. 

## 2018-07-12 NOTE — Telephone Encounter (Signed)
Med sent to pharmacy for 1 month. Left message with patient about appt

## 2018-07-12 NOTE — Telephone Encounter (Signed)
Detailed message left for patient that he needs to be seen. 

## 2018-07-12 NOTE — Telephone Encounter (Signed)
Last lipid 12/19/17  Dr Ermalinda MemosBradshaw

## 2018-08-16 ENCOUNTER — Other Ambulatory Visit: Payer: Self-pay | Admitting: *Deleted

## 2018-08-16 MED ORDER — METOPROLOL TARTRATE 100 MG PO TABS: 50.0000 mg | ORAL_TABLET | Freq: Two times a day (BID) | ORAL | 0 refills | Status: DC

## 2018-08-22 ENCOUNTER — Ambulatory Visit (INDEPENDENT_AMBULATORY_CARE_PROVIDER_SITE_OTHER): Payer: PPO | Admitting: Neurology

## 2018-08-22 ENCOUNTER — Encounter: Payer: Self-pay | Admitting: Neurology

## 2018-08-22 VITALS — BP 121/60 | HR 71 | Wt 205.0 lb

## 2018-08-22 DIAGNOSIS — G20 Parkinson's disease: Secondary | ICD-10-CM | POA: Diagnosis not present
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
## 2018-08-27 ENCOUNTER — Encounter: Payer: Self-pay | Admitting: Family Medicine

## 2018-08-27 ENCOUNTER — Ambulatory Visit (INDEPENDENT_AMBULATORY_CARE_PROVIDER_SITE_OTHER): Payer: PPO | Admitting: Family Medicine

## 2018-08-27 VITALS — BP 124/77 | HR 70 | Temp 98.1°F | Ht 70.0 in | Wt 204.0 lb

## 2018-08-27 DIAGNOSIS — E782 Mixed hyperlipidemia: Secondary | ICD-10-CM

## 2018-08-27 DIAGNOSIS — R0683 Snoring: Secondary | ICD-10-CM | POA: Diagnosis not present

## 2018-08-27 DIAGNOSIS — Z23 Encounter for immunization: Secondary | ICD-10-CM

## 2018-08-27 DIAGNOSIS — I10 Essential (primary) hypertension: Secondary | ICD-10-CM

## 2018-08-27 DIAGNOSIS — L409 Psoriasis, unspecified: Secondary | ICD-10-CM

## 2018-08-27 DIAGNOSIS — G20 Parkinson's disease: Secondary | ICD-10-CM | POA: Diagnosis not present

## 2018-08-27 DIAGNOSIS — G4719 Other hypersomnia: Secondary | ICD-10-CM

## 2018-08-27 MED ORDER — CALCIPOTRIENE 0.005 % EX OINT: TOPICAL_OINTMENT | Freq: Two times a day (BID) | CUTANEOUS | 1 refills | Status: DC

## 2018-08-27 MED ORDER — FENOFIBRATE 145 MG PO TABS: 145.0000 mg | ORAL_TABLET | Freq: Every day | ORAL | 3 refills | Status: DC

## 2018-08-27 MED ORDER — METOPROLOL TARTRATE 100 MG PO TABS: 50.0000 mg | ORAL_TABLET | Freq: Two times a day (BID) | ORAL | 3 refills | Status: DC

## 2018-08-27 MED ORDER — HALOBETASOL PROPIONATE 0.05 % EX CREA: TOPICAL_CREAM | Freq: Two times a day (BID) | CUTANEOUS | 0 refills | Status: DC
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
## 2018-08-27 NOTE — Patient Instructions (Addendum)
Use the Dovonox ointment applied to the affected areas twice daily in addition to the halobetasol twice daily for 2 weeks.  Then use only the Dovonox applied to the affected areas twice daily during the week (M-F) and halobetasol twice daily on weekends (Saturday-Sunday).  See me back in 3 months for recheck.  **DO NOT apply creams to face, axilla or groin areas)  You had labs performed today.  You will be contacted with the results of the labs once they are available, usually in the next 3 business days for routine lab work.  If you had a pap smear or biopsy performed, expect to be contacted in about 7-10 days.  Psoriasis Psoriasis is a long-term (chronic) skin condition. It causes raised, red patches (plaques) on your skin that look silvery. The red patches may show up anywhere on your body. They can be any size or shape. Psoriasis can come and go. It can range from mild to very bad. It cannot be passed from one person to another (not contagious). There is no cure for this condition, but it can be helped with treatment. Follow these instructions at home: Skin Care  Apply moisturizers to your skin as needed. Only use those that your doctor has said are okay.  Apply cool compresses to the affected areas.  Do not scratch your skin. Lifestyle   Do not use tobacco products. This includes cigarettes, chewing tobacco, and e-cigarettes. If you need help quitting, ask your doctor.  Drink little or no alcohol.  Try to lower your stress. Meditation or yoga may help.  Get sun as told by your doctor. Do not get sunburned.  Think about joining a psoriasis support group. Medicines  Take or use over-the-counter and prescription medicines only as told by your doctor.  If you were prescribed an antibiotic, take or use it as told by your doctor. Do not stop taking the antibiotic even if your condition starts to get better. General instructions  Keep a journal. Use this to help track what triggers  an outbreak. Try to avoid any triggers.  See a counselor or social worker if you feel very sad, upset, or hopeless about your condition and these feelings affect your work or relationships.  Keep all follow-up visits as told by your doctor. This is important. Contact a doctor if:  Your pain gets worse.  You have more redness or warmth in the affected areas.  You have new pain or stiffness in your joints.  Your pain or stiffness in your joints gets worse.  Your nails start to break easily.  Your nails pull away from the nail bed easily.  You have a fever.  You feel very sad (depressed). This information is not intended to replace advice given to you by your health care provider. Make sure you discuss any questions you have with your health care provider. Document Released: 12/21/2004 Document Revised: 04/20/2016 Document Reviewed: 03/31/2015 Elsevier Interactive Patient Education  2018 [employer].

## 2018-08-28 LAB — CBC
HEMATOCRIT: 48.5 % (ref 37.5–51.0)
Hemoglobin: 16 g/dL (ref 13.0–17.7)
MCH: 29.7 pg (ref 26.6–33.0)
MCHC: 33 g/dL (ref 31.5–35.7)
MCV: 90 fL (ref 79–97)
PLATELETS: 264 10*3/uL (ref 150–450)
RBC: 5.39 x10E6/uL (ref 4.14–5.80)
RDW: 12.9 % (ref 12.3–15.4)
WBC: 6.4 10*3/uL (ref 3.4–10.8)

## 2018-08-28 LAB — CMP14+EGFR
ALBUMIN: 4.5 g/dL (ref 3.5–4.8)
ALT: 18 IU/L (ref 0–44)
AST: 23 IU/L (ref 0–40)
Albumin/Globulin Ratio: 1.7 (ref 1.2–2.2)
Alkaline Phosphatase: 92 IU/L (ref 39–117)
BUN / CREAT RATIO: 14 (ref 10–24)
BUN: 15 mg/dL (ref 8–27)
Bilirubin Total: 0.8 mg/dL (ref 0.0–1.2)
CALCIUM: 9.9 mg/dL (ref 8.6–10.2)
CO2: 22 mmol/L (ref 20–29)
CREATININE: 1.06 mg/dL (ref 0.76–1.27)
Chloride: 103 mmol/L (ref 96–106)
GFR calc Af Amer: 81 mL/min/{1.73_m2} (ref >59)
GFR, EST NON AFRICAN AMERICAN: 70 mL/min/{1.73_m2} (ref >59)
GLOBULIN, TOTAL: 2.6 g/dL (ref 1.5–4.5)
GLUCOSE: 83 mg/dL (ref 65–99)
Potassium: 4.8 mmol/L (ref 3.5–5.2)
SODIUM: 143 mmol/L (ref 134–144)
TOTAL PROTEIN: 7.1 g/dL (ref 6.0–8.5)

## 2018-08-28 LAB — LIPID PANEL
Chol/HDL Ratio: 4.8 ratio (ref 0.0–5.0)
Cholesterol, Total: 152 mg/dL (ref 100–199)
HDL: 32 mg/dL — AB (ref >39)
LDL CALC: 84 mg/dL (ref 0–99)
TRIGLYCERIDES: 181 mg/dL — AB (ref 0–149)
VLDL Cholesterol Cal: 36 mg/dL (ref 5–40)

## 2018-08-28 LAB — TSH: TSH: 1.7 u[IU]/mL (ref 0.450–4.500)

## 2018-09-12 ENCOUNTER — Encounter: Payer: Self-pay | Admitting: *Deleted

## 2018-12-30 ENCOUNTER — Ambulatory Visit (INDEPENDENT_AMBULATORY_CARE_PROVIDER_SITE_OTHER): Payer: PPO | Admitting: Family Medicine

## 2018-12-30 VITALS — BP 134/86 | HR 71 | Temp 97.0°F | Ht >= 80 in | Wt 206.0 lb

## 2018-12-30 DIAGNOSIS — I10 Essential (primary) hypertension: Secondary | ICD-10-CM

## 2018-12-30 DIAGNOSIS — G20 Parkinson's disease: Secondary | ICD-10-CM

## 2018-12-30 DIAGNOSIS — E782 Mixed hyperlipidemia: Secondary | ICD-10-CM | POA: Diagnosis not present

## 2018-12-30 DIAGNOSIS — L409 Psoriasis, unspecified: Secondary | ICD-10-CM

## 2018-12-30 MED ORDER — HALOBETASOL PROPIONATE 0.05 % EX CREA: TOPICAL_CREAM | Freq: Two times a day (BID) | CUTANEOUS | 0 refills | Status: DC

## 2018-12-30 NOTE — Patient Instructions (Addendum)
Your cholesterol was improved on last check.  We do not have to check this again until October.   Use the Dovonox ointment applied to the affected areas twice daily PLUS the halobetasol twice daily for 2 weeks.    Then use ONLY the Dovonox applied to the affected areas twice daily during the week (M-F) and halobetasol twice daily on weekends (Saturday-Sunday).    See me back in 4 months for recheck.  (**DO NOT apply creams to face, axilla or groin areas)  The phone number to the sleep specialist is: [phone] (this is Dr Anne Hahn' office at [facility]).  They can arrange the sleep study for you.
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
## 2019-01-30 ENCOUNTER — Other Ambulatory Visit: Payer: PPO

## 2019-01-30 ENCOUNTER — Other Ambulatory Visit: Payer: Self-pay | Admitting: Family Medicine

## 2019-01-30 DIAGNOSIS — I10 Essential (primary) hypertension: Secondary | ICD-10-CM

## 2019-01-30 DIAGNOSIS — E782 Mixed hyperlipidemia: Secondary | ICD-10-CM | POA: Diagnosis not present

## 2019-01-31 LAB — CMP14+EGFR
ALBUMIN: 4.1 g/dL (ref 3.7–4.7)
ALT: 15 IU/L (ref 0–44)
AST: 22 IU/L (ref 0–40)
Albumin/Globulin Ratio: 1.5 (ref 1.2–2.2)
Alkaline Phosphatase: 105 IU/L (ref 39–117)
BILIRUBIN TOTAL: 0.7 mg/dL (ref 0.0–1.2)
BUN / CREAT RATIO: 18 (ref 10–24)
BUN: 18 mg/dL (ref 8–27)
CO2: 21 mmol/L (ref 20–29)
CREATININE: 0.98 mg/dL (ref 0.76–1.27)
Calcium: 9.4 mg/dL (ref 8.6–10.2)
Chloride: 103 mmol/L (ref 96–106)
GFR calc Af Amer: 89 mL/min/{1.73_m2} (ref >59)
GFR calc non Af Amer: 77 mL/min/{1.73_m2} (ref >59)
GLOBULIN, TOTAL: 2.7 g/dL (ref 1.5–4.5)
GLUCOSE: 94 mg/dL (ref 65–99)
Potassium: 4 mmol/L (ref 3.5–5.2)
SODIUM: 140 mmol/L (ref 134–144)
Total Protein: 6.8 g/dL (ref 6.0–8.5)

## 2019-01-31 LAB — LIPID PANEL
Chol/HDL Ratio: 4.1 ratio (ref 0.0–5.0)
Cholesterol, Total: 145 mg/dL (ref 100–199)
HDL: 35 mg/dL — AB (ref >39)
LDL CALC: 87 mg/dL (ref 0–99)
Triglycerides: 113 mg/dL (ref 0–149)
VLDL CHOLESTEROL CAL: 23 mg/dL (ref 5–40)

## 2019-03-10 ENCOUNTER — Telehealth: Payer: Self-pay

## 2019-03-10 NOTE — Telephone Encounter (Signed)
I contacted the pt and left a vm in regards to his 03/12/19 appt. I advised the pt to call back to discuss a virtual video visit.   If pt calls back please offer video visit first then telephone if he declines video.

## 2019-03-11 NOTE — Telephone Encounter (Signed)
I attempted to reach the patient again to discuss virtual visit for 03/12/19. Lvm for pt to call back.

## 2019-03-12 ENCOUNTER — Ambulatory Visit: Payer: PPO | Admitting: Neurology

## 2019-04-11 ENCOUNTER — Telehealth: Payer: Self-pay | Admitting: Family Medicine

## 2019-04-28 ENCOUNTER — Other Ambulatory Visit: Payer: Self-pay

## 2019-04-28 ENCOUNTER — Ambulatory Visit (INDEPENDENT_AMBULATORY_CARE_PROVIDER_SITE_OTHER): Payer: PPO | Admitting: *Deleted

## 2019-04-28 DIAGNOSIS — Z0000 Encounter for general adult medical examination without abnormal findings: Secondary | ICD-10-CM

## 2019-04-28 DIAGNOSIS — Z1211 Encounter for screening for malignant neoplasm of colon: Secondary | ICD-10-CM | POA: Diagnosis not present

## 2019-04-28 NOTE — Patient Instructions (Signed)
Preventive Care 2 Years and Older, Male Preventive care refers to lifestyle choices and visits with your health care provider that can promote health and wellness. What does preventive care include?   A yearly physical exam. This is also called an annual well check.  Dental exams once or twice a year.  Routine eye exams. Ask your health care provider how often you should have your eyes checked.  Personal lifestyle choices, including: ? Daily care of your teeth and gums. ? Regular physical activity. ? Eating a healthy diet. ? Avoiding tobacco and drug use. ? Limiting alcohol use. ? Practicing safe sex. ? Taking low doses of aspirin every day. ? Taking vitamin and mineral supplements as recommended by your health care provider. What happens during an annual well check? The services and screenings done by your health care provider during your annual well check will depend on your age, overall health, lifestyle risk factors, and family history of disease. Counseling Your health care provider may ask you questions about your:  Alcohol use.  Tobacco use.  Drug use.  Emotional well-being.  Home and relationship well-being.  Sexual activity.  Eating habits.  History of falls.  Memory and ability to understand (cognition).  Work and work Statistician. Screening You may have the following tests or measurements:  Height, weight, and BMI.  Blood pressure.  Lipid and cholesterol levels. These may be checked every 5 years, or more frequently if you are over 9 years old.  Skin check.  Lung cancer screening. You may have this screening every year starting at age 57 if you have a 30-pack-year history of smoking and currently smoke or have quit within the past 15 years.  Colorectal cancer screening. All adults should have this screening starting at age 90 and continuing until age 69. You will have tests every 1-10 years, depending on your results and the type of screening  test. People at increased risk should start screening at an earlier age. Screening tests may include: ? Guaiac-based fecal occult blood testing. ? Fecal immunochemical test (FIT). ? Stool DNA test. ? Virtual colonoscopy. ? Sigmoidoscopy. During this test, a flexible tube with a tiny camera (sigmoidoscope) is used to examine your rectum and lower colon. The sigmoidoscope is inserted through your anus into your rectum and lower colon. ? Colonoscopy. During this test, a long, thin, flexible tube with a tiny camera (colonoscope) is used to examine your entire colon and rectum.  Prostate cancer screening. Recommendations will vary depending on your family history and other risks.  Hepatitis C blood test.  Hepatitis B blood test.  Sexually transmitted disease (STD) testing.  Diabetes screening. This is done by checking your blood sugar (glucose) after you have not eaten for a while (fasting). You may have this done every 1-3 years.  Abdominal aortic aneurysm (AAA) screening. You may need this if you are a current or former smoker.  Osteoporosis. You may be screened starting at age 30 if you are at high risk. Talk with your health care provider about your test results, treatment options, and if necessary, the need for more tests. Vaccines Your health care provider may recommend certain vaccines, such as:  Influenza vaccine. This is recommended every year.  Tetanus, diphtheria, and acellular pertussis (Tdap, Td) vaccine. You may need a Td booster every 10 years.  Varicella vaccine. You may need this if you have not been vaccinated.  Zoster vaccine. You may need this after age 42.  Measles, mumps, and rubella (MMR) vaccine.  You may need at least one dose of MMR if you were born in 1957 or later. You may also need a second dose.  Pneumococcal 13-valent conjugate (PCV13) vaccine. One dose is recommended after age 65.  Pneumococcal polysaccharide (PPSV23) vaccine. One dose is recommended  after age 65.  Meningococcal vaccine. You may need this if you have certain conditions.  Hepatitis A vaccine. You may need this if you have certain conditions or if you travel or work in places where you may be exposed to hepatitis A.  Hepatitis B vaccine. You may need this if you have certain conditions or if you travel or work in places where you may be exposed to hepatitis B.  Haemophilus influenzae type b (Hib) vaccine. You may need this if you have certain risk factors. Talk to your health care provider about which screenings and vaccines you need and how often you need them. This information is not intended to replace advice given to you by your health care provider. Make sure you discuss any questions you have with your health care provider. Document Released: 12/10/2015 Document Revised: 01/03/2018 Document Reviewed: 09/14/2015 Elsevier Interactive Patient Education  2019 Elsevier Inc.  
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
## 2019-07-08 ENCOUNTER — Other Ambulatory Visit: Payer: Self-pay

## 2019-07-08 ENCOUNTER — Ambulatory Visit (INDEPENDENT_AMBULATORY_CARE_PROVIDER_SITE_OTHER): Payer: PPO | Admitting: Family Medicine

## 2019-07-08 VITALS — BP 135/78 | HR 65 | Temp 98.6°F | Ht 68.0 in | Wt 196.0 lb

## 2019-07-08 DIAGNOSIS — Z86010 Personal history of colonic polyps: Secondary | ICD-10-CM | POA: Diagnosis not present

## 2019-07-08 DIAGNOSIS — K5900 Constipation, unspecified: Secondary | ICD-10-CM

## 2019-07-08 DIAGNOSIS — N3000 Acute cystitis without hematuria: Secondary | ICD-10-CM

## 2019-07-08 DIAGNOSIS — R399 Unspecified symptoms and signs involving the genitourinary system: Secondary | ICD-10-CM | POA: Diagnosis not present

## 2019-07-08 DIAGNOSIS — Z1211 Encounter for screening for malignant neoplasm of colon: Secondary | ICD-10-CM | POA: Diagnosis not present

## 2019-07-08 LAB — URINALYSIS, COMPLETE
Bilirubin, UA: NEGATIVE
Glucose, UA: NEGATIVE
Ketones, UA: NEGATIVE
Nitrite, UA: POSITIVE — AB
Protein,UA: NEGATIVE
RBC, UA: NEGATIVE
Specific Gravity, UA: 1.025 (ref 1.005–1.030)
Urobilinogen, Ur: 4 mg/dL — ABNORMAL HIGH (ref 0.2–1.0)
pH, UA: 7 (ref 5.0–7.5)

## 2019-07-08 LAB — MICROSCOPIC EXAMINATION: RBC: NONE SEEN /hpf (ref 0–2)

## 2019-07-08 MED ORDER — CEPHALEXIN 500 MG PO CAPS: 500.0000 mg | ORAL_CAPSULE | Freq: Two times a day (BID) | ORAL | 0 refills | Status: AC

## 2019-07-08 MED ORDER — POLYETHYLENE GLYCOL 3350 17 GM/SCOOP PO POWD: ORAL | 12 refills | Status: DC

## 2019-07-08 NOTE — Patient Instructions (Addendum)
I have sent in Miralax to help soften your stools.  Make sure you are drinking enough water.  Avoid drinking too many sodas.  I have placed a referral to the gastric doctor to do your colonoscopy.  Since you had a polyp before, you are not a candidate for the Cologuard that you saw on TV.  Your urine does appear infected today. I have sent an antibiotic in.  It is probably dark sometimes because you aren't drinking enough water.    Urinary Tract Infection, Adult A urinary tract infection (UTI) is an infection of any part of the urinary tract. The urinary tract includes:  The kidneys.  The ureters.  The bladder.  The urethra. These organs make, store, and get rid of pee (urine) in the body. What are the causes? This is caused by germs (bacteria) in your genital area. These germs grow and cause swelling (inflammation) of your urinary tract. What increases the risk? You are more likely to develop this condition if:  You have a small, thin tube (catheter) to drain pee.  You cannot control when you pee or poop (incontinence).  You are male, and: ? You use these methods to prevent pregnancy: ? A medicine that kills sperm (spermicide). ? A device that blocks sperm (diaphragm). ? You have low levels of a male hormone (estrogen). ? You are pregnant.  You have genes that add to your risk.  You are sexually active.  You take antibiotic medicines.  You have trouble peeing because of: ? A prostate that is bigger than normal, if you are male. ? A blockage in the part of your body that drains pee from the bladder (urethra). ? A kidney stone. ? A nerve condition that affects your bladder (neurogenic bladder). ? Not getting enough to drink. ? Not peeing often enough.  You have other conditions, such as: ? Diabetes. ? A weak disease-fighting system (immune system). ? Sickle cell disease. ? Gout. ? Injury of the spine. What are the signs or symptoms? Symptoms of this condition  include:  Needing to pee right away (urgently).  Peeing often.  Peeing small amounts often.  Pain or burning when peeing.  Blood in the pee.  Pee that smells bad or not like normal.  Trouble peeing.  Pee that is cloudy.  Fluid coming from the vagina, if you are male.  Pain in the belly or lower back. Other symptoms include:  Throwing up (vomiting).  No urge to eat.  Feeling mixed up (confused).  Being tired and grouchy (irritable).  A fever.  Watery poop (diarrhea). How is this treated? This condition may be treated with:  Antibiotic medicine.  Other medicines.  Drinking enough water. Follow these instructions at home:  Medicines  Take over-the-counter and prescription medicines only as told by your doctor.  If you were prescribed an antibiotic medicine, take it as told by your doctor. Do not stop taking it even if you start to feel better. General instructions  Make sure you: ? Pee until your bladder is empty. ? Do not hold pee for a long time. ? Empty your bladder after sex. ? Wipe from front to back after pooping if you are a male. Use each tissue one time when you wipe.  Drink enough fluid to keep your pee pale yellow.  Keep all follow-up visits as told by your doctor. This is important. Contact a doctor if:  You do not get better after 1-2 days.  Your symptoms go away and  then come back. Get help right away if:  You have very bad back pain.  You have very bad pain in your lower belly.  You have a fever.  You are sick to your stomach (nauseous).  You are throwing up. Summary  A urinary tract infection (UTI) is an infection of any part of the urinary tract.  This condition is caused by germs in your genital area.  There are many risk factors for a UTI. These include having a small, thin tube to drain pee and not being able to control when you pee or poop.  Treatment includes antibiotic medicines for germs.  Drink enough  fluid to keep your pee pale yellow. This information is not intended to replace advice given to you by your health care provider. Make sure you discuss any questions you have with your health care provider. Document Released: 05/01/2008 Document Revised: 10/31/2018 Document Reviewed: 05/23/2018 Elsevier Patient Education  2020 [employer]   Constipation, Adult Constipation is when a person:  Poops (has a bowel movement) fewer times in a week than normal.  Has a hard time pooping.  Has poop that is dry, hard, or bigger than normal. Follow these instructions at home: Eating and drinking   Eat foods that have a lot of fiber, such as: ? Fresh fruits and vegetables. ? Whole grains. ? Beans.  Eat less of foods that are high in fat, low in fiber, or overly processed, such as: ? JamaicaFrench fries. ? Hamburgers. ? Cookies. ? Candy. ? Soda.  Drink enough fluid to keep your pee (urine) clear or pale yellow. General instructions  Exercise regularly or as told by your doctor.  Go to the restroom when you feel like you need to poop. Do not hold it in.  Take over-the-counter and prescription medicines only as told by your doctor. These include any fiber supplements.  Do pelvic floor retraining exercises, such as: ? Doing deep breathing while relaxing your lower belly (abdomen). ? Relaxing your pelvic floor while pooping.  Watch your condition for any changes.  Keep all follow-up visits as told by your doctor. This is important. Contact a doctor if:  You have pain that gets worse.  You have a fever.  You have not pooped for 4 days.  You throw up (vomit).  You are not hungry.  You lose weight.  You are bleeding from the anus.  You have thin, pencil-like poop (stool). Get help right away if:  You have a fever, and your symptoms suddenly get worse.  You leak poop or have blood in your poop.  Your belly feels hard or bigger than normal (is bloated).  You have very bad  belly pain.  You feel dizzy or you faint. This information is not intended to replace advice given to you by your health care provider. Make sure you discuss any questions you have with your health care provider. Document Released: 05/01/2008 Document Revised: 10/26/2017 Document Reviewed: 05/03/2016 Elsevier Patient Education  2020 [employer]
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
## 2019-07-10 LAB — URINE CULTURE

## 2019-07-21 ENCOUNTER — Other Ambulatory Visit: Payer: Self-pay | Admitting: Family Medicine

## 2019-07-21 DIAGNOSIS — K5900 Constipation, unspecified: Secondary | ICD-10-CM

## 2019-07-21 DIAGNOSIS — Z860100 Personal history of colon polyps, unspecified: Secondary | ICD-10-CM

## 2019-07-25 ENCOUNTER — Other Ambulatory Visit: Payer: Self-pay | Admitting: Family Medicine

## 2019-07-25 DIAGNOSIS — K625 Hemorrhage of anus and rectum: Secondary | ICD-10-CM

## 2019-07-25 DIAGNOSIS — K5900 Constipation, unspecified: Secondary | ICD-10-CM

## 2019-07-25 DIAGNOSIS — Z1211 Encounter for screening for malignant neoplasm of colon: Secondary | ICD-10-CM

## 2019-07-25 DIAGNOSIS — Z86010 Personal history of colonic polyps: Secondary | ICD-10-CM

## 2019-07-30 ENCOUNTER — Telehealth: Payer: Self-pay | Admitting: Family Medicine

## 2019-07-30 NOTE — Telephone Encounter (Signed)
Ok to come in and give a urine sample.  However, in the absence of urinary symptoms, not sure what we would be treating.

## 2019-07-31 ENCOUNTER — Encounter: Payer: Self-pay | Admitting: Gastroenterology

## 2019-07-31 NOTE — Telephone Encounter (Signed)
Patient aware it is okay to drop off urine sample.  He reports that his daughter is an NP and she has already tested the urine and there is no infection.

## 2019-08-21 ENCOUNTER — Ambulatory Visit (INDEPENDENT_AMBULATORY_CARE_PROVIDER_SITE_OTHER): Payer: PPO | Admitting: Neurology

## 2019-08-21 ENCOUNTER — Other Ambulatory Visit: Payer: Self-pay

## 2019-08-21 ENCOUNTER — Encounter: Payer: Self-pay | Admitting: Neurology

## 2019-08-21 VITALS — BP 108/67 | HR 64 | Temp 97.1°F | Ht 70.0 in | Wt 194.3 lb

## 2019-08-21 DIAGNOSIS — G20 Parkinson's disease: Secondary | ICD-10-CM | POA: Diagnosis not present

## 2019-08-21 MED ORDER — PRAMIPEXOLE DIHYDROCHLORIDE 0.125 MG PO TABS: ORAL_TABLET | ORAL | 1 refills | Status: DC

## 2019-08-21 NOTE — Patient Instructions (Signed)
We will start Mirapex for the parkinson's.   Mirapex (pramipexole) may result in confusion or hallucinations, dizziness, drowsiness, or nausea. If any significant side effects are noted, please contact our office.
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
## 2019-09-02 ENCOUNTER — Other Ambulatory Visit: Payer: Self-pay | Admitting: Family Medicine

## 2019-09-02 ENCOUNTER — Ambulatory Visit: Payer: PPO | Admitting: Gastroenterology

## 2019-09-02 ENCOUNTER — Encounter: Payer: Self-pay | Admitting: Gastroenterology

## 2019-09-02 VITALS — BP 132/72 | HR 80 | Temp 97.9°F | Ht 69.0 in | Wt 195.0 lb

## 2019-09-02 DIAGNOSIS — R195 Other fecal abnormalities: Secondary | ICD-10-CM | POA: Diagnosis not present

## 2019-09-02 DIAGNOSIS — K5900 Constipation, unspecified: Secondary | ICD-10-CM

## 2019-09-02 DIAGNOSIS — K219 Gastro-esophageal reflux disease without esophagitis: Secondary | ICD-10-CM

## 2019-09-02 MED ORDER — NA SULFATE-K SULFATE-MG SULF 17.5-3.13-1.6 GM/177ML PO SOLN: 1.0000 | Freq: Once | ORAL | 0 refills | Status: AC
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
## 2019-09-02 NOTE — Patient Instructions (Signed)
You have been scheduled for a colonoscopy. Please follow written instructions given to you at your visit today.  Please pick up your prep supplies at the pharmacy within the next 1-3 days. If you use inhalers (even only as needed), please bring them with you on the day of your procedure. Your physician has requested that you go to www.startemmi.com and enter the access code given to you at your visit today. This web site gives a general overview about your procedure. However, you should still follow specific instructions given to you by our office regarding your preparation for the procedure.  Thank you for choosing me and [facility].  Malcolm T. Stark, Jr., MD., FACG  

## 2019-09-03 ENCOUNTER — Encounter: Payer: Self-pay | Admitting: Gastroenterology

## 2019-09-11 ENCOUNTER — Telehealth: Payer: Self-pay

## 2019-09-11 NOTE — Telephone Encounter (Signed)
Covid-19 screening questions   Do you now or have you had a fever in the last 14 days? NO   Do you have any respiratory symptoms of shortness of breath or cough now or in the last 14 days? NO  Do you have any family members or close contacts with diagnosed or suspected Covid-19 in the past 14 days? NO  Have you been tested for Covid-19 and found to be positive? NO        

## 2019-09-12 ENCOUNTER — Ambulatory Visit (AMBULATORY_SURGERY_CENTER): Payer: PPO | Admitting: Gastroenterology

## 2019-09-12 ENCOUNTER — Other Ambulatory Visit: Payer: Self-pay

## 2019-09-12 ENCOUNTER — Encounter: Payer: Self-pay | Admitting: Gastroenterology

## 2019-09-12 VITALS — BP 108/65 | HR 65 | Temp 97.9°F | Resp 20

## 2019-09-12 DIAGNOSIS — K5730 Diverticulosis of large intestine without perforation or abscess without bleeding: Secondary | ICD-10-CM

## 2019-09-12 DIAGNOSIS — K648 Other hemorrhoids: Secondary | ICD-10-CM

## 2019-09-12 DIAGNOSIS — R195 Other fecal abnormalities: Secondary | ICD-10-CM

## 2019-09-12 DIAGNOSIS — K5900 Constipation, unspecified: Secondary | ICD-10-CM | POA: Diagnosis not present

## 2019-09-12 MED ORDER — SODIUM CHLORIDE 0.9 % IV SOLN: 500.0000 mL | Freq: Once | INTRAVENOUS | Status: DC

## 2019-09-12 NOTE — Progress Notes (Signed)
PT taken to PACU. Monitors in place. VSS. Report given to RN. 

## 2019-09-12 NOTE — Op Note (Signed)
[facility] Patient Name: Randy [name] Procedure Date: 09/12/2019 10:19 AM MRN: [id number] Endoscopist: Meryl DareMalcolm T Aarit Kashuba , MD Age: [age] Referring MD:  Date of Birth: 1946-11-23 Gender: Male Account #: [id number] Procedure:                Colonoscopy Indications:              Heme positive stool Medicines:                Monitored Anesthesia Care Procedure:                Pre-Anesthesia Assessment:                           - Prior to the procedure, a History and Physical                            was performed, and patient medications and                            allergies were reviewed. The patient's tolerance of                            previous anesthesia was also reviewed. The risks                            and benefits of the procedure and the sedation                            options and risks were discussed with the patient.                            All questions were answered, and informed consent                            was obtained. Prior Anticoagulants: The patient has                            taken no previous anticoagulant or antiplatelet                            agents. ASA Grade Assessment: II - A patient with                            mild systemic disease. After reviewing the risks                            and benefits, the patient was deemed in                            satisfactory condition to undergo the procedure.                           After obtaining informed consent, the colonoscope  was passed under direct vision. Throughout the                            procedure, the patient's blood pressure, pulse, and                            oxygen saturations were monitored continuously. The                            Colonoscope was introduced through the anus and                            advanced to the the cecum, identified by                            appendiceal orifice and ileocecal valve. The                           ileocecal valve, appendiceal orifice, and rectum                            were photographed. The quality of the bowel                            preparation was good. The colonoscopy was performed                            without difficulty. The patient tolerated the                            procedure well. Scope In: 10:31:25 AM Scope Out: 10:48:02 AM Scope Withdrawal Time: 0 hours 13 minutes 54 seconds  Total Procedure Duration: 0 hours 16 minutes 37 seconds  Findings:                 The perianal and digital rectal examinations were                            normal.                           A single medium-mouthed diverticulum was found in                            the ascending colon.                           External hemorrhoids were found during                            retroflexion. The hemorrhoids were small.                           The exam was otherwise without abnormality on  direct and retroflexion views. Complications:            No immediate complications. Estimated blood loss:                            None. Estimated Blood Loss:     Estimated blood loss: none. Impression:               - Mild diverticulosis in the ascending colon.                           - External hemorrhoids.                           - The examination was otherwise normal on direct                            and retroflexion views.                           - No specimens collected. Recommendation:           - Patient has a contact number available for                            emergencies. The signs and symptoms of potential                            delayed complications were discussed with the                            patient. Return to normal activities tomorrow.                            Written discharge instructions were provided to the                            patient.                           - Resume previous diet.                            - Continue present medications.                           - No repeat colonoscopy due to age and the absence                            of colonic polyps. Meryl Dare, MD 09/12/2019 10:51:27 AM This report has been signed electronically.

## 2019-09-12 NOTE — Progress Notes (Signed)
Temp JB  v/s CW I have reviewed the patient's medical history in detail and updated the computerized patient record. 

## 2019-09-12 NOTE — Patient Instructions (Signed)
Handouts given for hemorrhoids and diverticulosis.  YOU HAD AN ENDOSCOPIC PROCEDURE TODAY AT THE [facility]:   Refer to the procedure report that was given to you for any specific questions about what was found during the examination.  If the procedure report does not answer your questions, please call your gastroenterologist to clarify.  If you requested that your care partner not be given the details of your procedure findings, then the procedure report has been included in a sealed envelope for you to review at your convenience later.  YOU SHOULD EXPECT: Some feelings of bloating in the abdomen. Passage of more gas than usual.  Walking can help get rid of the air that was put into your GI tract during the procedure and reduce the bloating. If you had a lower endoscopy (such as a colonoscopy or flexible sigmoidoscopy) you may notice spotting of blood in your stool or on the toilet paper. If you underwent a bowel prep for your procedure, you may not have a normal bowel movement for a few days.  Please Note:  You might notice some irritation and congestion in your nose or some drainage.  This is from the oxygen used during your procedure.  There is no need for concern and it should clear up in a day or so.  SYMPTOMS TO REPORT IMMEDIATELY:   Following lower endoscopy (colonoscopy or flexible sigmoidoscopy):  Excessive amounts of blood in the stool  Significant tenderness or worsening of abdominal pains  Swelling of the abdomen that is new, acute  Fever of 100F or higher  For urgent or emergent issues, a gastroenterologist can be reached at any hour by calling [phone].   DIET:  We do recommend a small meal at first, but then you may proceed to your regular diet.  Drink plenty of fluids but you should avoid alcoholic beverages for 24 hours.  ACTIVITY:  You should plan to take it easy for the rest of today and you should NOT DRIVE or use heavy machinery until tomorrow (because  of the sedation medicines used during the test).    FOLLOW UP: Our staff will call the number listed on your records 48-72 hours following your procedure to check on you and address any questions or concerns that you may have regarding the information given to you following your procedure. If we do not reach you, we will leave a message.  We will attempt to reach you two times.  During this call, we will ask if you have developed any symptoms of COVID 19. If you develop any symptoms (ie: fever, flu-like symptoms, shortness of breath, cough etc.) before then, please call [phone].  If you test positive for Covid 19 in the 2 weeks post procedure, please call and report this information to us.    If any biopsies were taken you will be contacted by phone or by letter within the next 1-3 weeks.  Please call us at [phone] if you have not heard about the biopsies in 3 weeks.    SIGNATURES/CONFIDENTIALITY: You and/or your care partner have signed paperwork which will be entered into your electronic medical record.  These signatures attest to the fact that that the information above on your After Visit Summary has been reviewed and is understood.  Full responsibility of the confidentiality of this discharge information lies with you and/or your care-partner. 

## 2019-09-16 ENCOUNTER — Telehealth: Payer: Self-pay

## 2019-09-16 NOTE — Telephone Encounter (Signed)
  Follow up Call-  Call back number 09/12/2019  Post procedure Call Back phone  # [phone]  Permission to leave phone message Yes  Some recent data might be hidden     Patient questions:  Do you have a fever, pain , or abdominal swelling? No. Pain Score  0 *  Have you tolerated food without any problems? Yes.    Have you been able to return to your normal activities? Yes.    Do you have any questions about your discharge instructions: Diet   No. Medications  No. Follow up visit  No.  Do you have questions or concerns about your Care? No.  Actions: * If pain score is 4 or above: No action needed, pain <4.  1. Have you developed a fever since your procedure? no  2.   Have you had an respiratory symptoms (SOB or cough) since your procedure? no  3.   Have you tested positive for COVID 19 since your procedure no  4.   Have you had any family members/close contacts diagnosed with the COVID 19 since your procedure?  no   If yes to any of these questions please route to Joylene John, RN and Alphonsa Gin, Therapist, sports.

## 2019-09-16 NOTE — Telephone Encounter (Signed)
Called [phone] and left a messaged we tried to reach pt for a follow up call. maw

## 2019-10-01 ENCOUNTER — Other Ambulatory Visit: Payer: Self-pay | Admitting: Family Medicine

## 2019-10-25 ENCOUNTER — Other Ambulatory Visit: Payer: Self-pay | Admitting: Family Medicine

## 2019-10-27 ENCOUNTER — Other Ambulatory Visit: Payer: Self-pay | Admitting: Family Medicine

## 2019-12-17 ENCOUNTER — Other Ambulatory Visit: Payer: Self-pay | Admitting: Family Medicine

## 2019-12-18 NOTE — Telephone Encounter (Signed)
ntbs  30 day supply given Last seen for chronic follow up 12/2018

## 2019-12-19 ENCOUNTER — Other Ambulatory Visit: Payer: Self-pay | Admitting: Family Medicine

## 2020-01-19 ENCOUNTER — Other Ambulatory Visit: Payer: Self-pay

## 2020-01-20 ENCOUNTER — Ambulatory Visit (INDEPENDENT_AMBULATORY_CARE_PROVIDER_SITE_OTHER): Payer: PPO | Admitting: Family Medicine

## 2020-01-20 ENCOUNTER — Encounter: Payer: Self-pay | Admitting: Family Medicine

## 2020-01-20 VITALS — BP 138/89 | HR 66 | Temp 97.5°F | Ht 69.0 in | Wt 202.0 lb

## 2020-01-20 DIAGNOSIS — L409 Psoriasis, unspecified: Secondary | ICD-10-CM

## 2020-01-20 DIAGNOSIS — E782 Mixed hyperlipidemia: Secondary | ICD-10-CM | POA: Diagnosis not present

## 2020-01-20 DIAGNOSIS — I10 Essential (primary) hypertension: Secondary | ICD-10-CM | POA: Diagnosis not present

## 2020-01-20 DIAGNOSIS — G20 Parkinson's disease: Secondary | ICD-10-CM | POA: Diagnosis not present

## 2020-01-20 DIAGNOSIS — Z125 Encounter for screening for malignant neoplasm of prostate: Secondary | ICD-10-CM | POA: Diagnosis not present

## 2020-01-20 MED ORDER — PRAMIPEXOLE DIHYDROCHLORIDE 0.25 MG PO TABS: 0.2500 mg | ORAL_TABLET | Freq: Three times a day (TID) | ORAL | 0 refills | Status: DC

## 2020-01-20 MED ORDER — HALOBETASOL PROPIONATE 0.05 % EX CREA: TOPICAL_CREAM | Freq: Two times a day (BID) | CUTANEOUS | 3 refills | Status: DC

## 2020-01-20 MED ORDER — CLOBETASOL PROPIONATE 0.05 % EX FOAM: Freq: Two times a day (BID) | CUTANEOUS | 2 refills | Status: DC
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
## 2020-01-20 NOTE — Patient Instructions (Addendum)
Your neurologist wanted you to go up to the 0.25 mg 3 times daily for your tremor/Parkinson's.  I have changed the prescription to this and would like you to follow-up with Dr. Anne Hahn within the next month or so to for recheck.  For your psoriasis, I have sent in the halobetasol for your skin.  You can apply this twice daily for 7 to 10 days per flare.  For your scalp I have sent in clobetasol foam.  Again you can use this for 7 to 10 days per flare.  You had labs performed today.  You will be contacted with the results of the labs once they are available, usually in the next 3 business days for routine lab work.  If you have an active my chart account, they will be released to your MyChart.  If you prefer to have these labs released to you via telephone, please let us know.  If you had a pap smear or biopsy performed, expect to be contacted in about 7-10 days.

## 2020-01-21 ENCOUNTER — Other Ambulatory Visit: Payer: Self-pay | Admitting: Family Medicine

## 2020-01-21 LAB — CMP14+EGFR
ALT: 18 IU/L (ref 0–44)
AST: 29 IU/L (ref 0–40)
Albumin/Globulin Ratio: 1.8 (ref 1.2–2.2)
Albumin: 4.2 g/dL (ref 3.7–4.7)
Alkaline Phosphatase: 96 IU/L (ref 39–117)
BUN/Creatinine Ratio: 15 (ref 10–24)
BUN: 16 mg/dL (ref 8–27)
Bilirubin Total: 0.6 mg/dL (ref 0.0–1.2)
CO2: 24 mmol/L (ref 20–29)
Calcium: 9.7 mg/dL (ref 8.6–10.2)
Chloride: 102 mmol/L (ref 96–106)
Creatinine, Ser: 1.04 mg/dL (ref 0.76–1.27)
GFR calc Af Amer: 82 mL/min/{1.73_m2} (ref >59)
GFR calc non Af Amer: 71 mL/min/{1.73_m2} (ref >59)
Globulin, Total: 2.4 g/dL (ref 1.5–4.5)
Glucose: 82 mg/dL (ref 65–99)
Potassium: 4.5 mmol/L (ref 3.5–5.2)
Sodium: 138 mmol/L (ref 134–144)
Total Protein: 6.6 g/dL (ref 6.0–8.5)

## 2020-01-21 LAB — LIPID PANEL
Chol/HDL Ratio: 4.1 ratio (ref 0.0–5.0)
Cholesterol, Total: 139 mg/dL (ref 100–199)
HDL: 34 mg/dL — ABNORMAL LOW (ref >39)
LDL Chol Calc (NIH): 82 mg/dL (ref 0–99)
Triglycerides: 129 mg/dL (ref 0–149)
VLDL Cholesterol Cal: 23 mg/dL (ref 5–40)

## 2020-01-21 LAB — CBC
Hematocrit: 47.1 % (ref 37.5–51.0)
Hemoglobin: 16.5 g/dL (ref 13.0–17.7)
MCH: 31.9 pg (ref 26.6–33.0)
MCHC: 35 g/dL (ref 31.5–35.7)
MCV: 91 fL (ref 79–97)
Platelets: 221 10*3/uL (ref 150–450)
RBC: 5.17 x10E6/uL (ref 4.14–5.80)
RDW: 13 % (ref 11.6–15.4)
WBC: 5.3 10*3/uL (ref 3.4–10.8)

## 2020-01-21 LAB — PSA: Prostate Specific Ag, Serum: 0.5 ng/mL (ref 0.0–4.0)

## 2020-01-21 LAB — TSH: TSH: 1.64 u[IU]/mL (ref 0.450–4.500)

## 2020-01-26 ENCOUNTER — Other Ambulatory Visit: Payer: Self-pay | Admitting: Family Medicine

## 2020-01-26 NOTE — Telephone Encounter (Signed)
  Medication Request  01/26/2020  What is the name of the medication? Fenofibrate 145 mg and other RX called in last week [facility] don't have but they confirm. Patient had appt with Nadine Counts last week and was supposed to send in and they hadn't got  Have you contacted your pharmacy to request a refill? YES  Which pharmacy would you like this sent to? [facility] in [place]   Patient notified that their request is being sent to the clinical staff for review and that they should receive a call once it is complete. If they do not receive a call within 24 hours they can check with their pharmacy or our office.

## 2020-01-27 MED ORDER — FENOFIBRATE 145 MG PO TABS: 145.0000 mg | ORAL_TABLET | Freq: Every day | ORAL | 1 refills | Status: DC

## 2020-01-27 NOTE — Telephone Encounter (Signed)
Pt aware refill sent to pharmacy 

## 2020-02-11 ENCOUNTER — Telehealth: Payer: Self-pay | Admitting: Family Medicine

## 2020-02-11 NOTE — Chronic Care Management (AMB) (Signed)
  Chronic Care Management   Outreach Note  02/11/2020 Name: Randy [name] MRN: [id number] DOB: 21-Dec-1945  Randy [name] is a 74 y.o. year old male who is a primary care patient of Delynn Flavin M, DO. I reached out to [employer] by phone today in response to a referral sent by Randy [name]'s health plan.     An unsuccessful telephone outreach was attempted today. The patient was referred to the case management team for assistance with care management and care coordination.   Follow Up Plan: A HIPPA compliant phone message was left for the patient providing contact information and requesting a return call.  The care management team will reach out to the patient again over the next 7 days.  If patient returns call to provider office, please advise to call Embedded Care Management Care Guide Gwenevere Ghazi at [phone].  Gwenevere Ghazi  Care Guide, Embedded Care Coordination [facility]  [place], [place] [address] Direct Dial: [phone] [email or web address] Website: [email or web address]

## 2020-02-16 NOTE — Chronic Care Management (AMB) (Signed)
  Chronic Care Management   Outreach Note  02/16/2020 Name: Randy [name] MRN: [id number] DOB: [birth year]  [name] is a 74 y.o. year old male who is a primary care patient of Delynn Flavin M, DO. I reached out to [employer] by phone today in response to a referral sent by [name]'s health plan.     A second unsuccessful telephone outreach was attempted today. The patient was referred to the case management team for assistance with care management and care coordination.   Follow Up Plan: A HIPPA compliant phone message was left for the patient providing contact information and requesting a return call.  The care management team will reach out to the patient again over the next 7 days.  If patient returns call to provider office, please advise to call Embedded Care Management Care Guide Gwenevere Ghazi at [phone].  Gwenevere Ghazi  Care Guide, Embedded Care Coordination [facility]  [place], [place] [address] Direct Dial: [phone] [email or web address] Website: [email or web address]

## 2020-02-18 NOTE — Chronic Care Management (AMB) (Signed)
  Chronic Care Management   Outreach Note  02/18/2020 Name: Randy [name] MRN: [id number] DOB: 11-Nov-1946  Randy [name] is a 74 y.o. year old male who is a primary care patient of Delynn Flavin M, DO. I reached out to [employer] by phone today in response to a referral sent by Randy [name]'s health plan.     Third unsuccessful telephone outreach was attempted today. The patient was referred to the case management team for assistance with care management and care coordination. The patient's primary care provider has been notified of our unsuccessful attempts to make or maintain contact with the patient. The care management team is pleased to engage with this patient at any time in the future should he/she be interested in assistance from the care management team.   Follow Up Plan: A HIPPA compliant phone message was left for the patient providing contact information and requesting a return call. If patient returns call to provider office, please advise to call Embedded Care Management Care Guide Gwenevere Ghazi at [phone].  Gwenevere Ghazi  Care Guide, Embedded Care Coordination [facility]  [place], [place] [address] Direct Dial: [phone] [email or web address] Website: [email or web address]

## 2020-02-24 ENCOUNTER — Ambulatory Visit: Payer: PPO | Admitting: Neurology

## 2020-03-29 ENCOUNTER — Encounter: Payer: Self-pay | Admitting: Family Medicine

## 2020-03-29 ENCOUNTER — Ambulatory Visit (INDEPENDENT_AMBULATORY_CARE_PROVIDER_SITE_OTHER): Payer: PPO | Admitting: Family Medicine

## 2020-03-29 DIAGNOSIS — J189 Pneumonia, unspecified organism: Secondary | ICD-10-CM

## 2020-03-29 MED ORDER — AMOXICILLIN-POT CLAVULANATE 875-125 MG PO TABS: 1.0000 | ORAL_TABLET | Freq: Two times a day (BID) | ORAL | 0 refills | Status: DC

## 2020-03-29 MED ORDER — ALBUTEROL SULFATE HFA 108 (90 BASE) MCG/ACT IN AERS: 2.0000 | INHALATION_SPRAY | Freq: Four times a day (QID) | RESPIRATORY_TRACT | 1 refills | Status: DC | PRN

## 2020-03-29 NOTE — Progress Notes (Signed)
Virtual Visit via telephone Note  I connected with Randy [name] on 03/29/20 at 1358by telephone and verified that I am speaking with the correct person using two identifiers. Randy [name] is currently located at home and wife are currently with her during visit. The provider, Fransisca Kaufmann Remington Highbaugh, MD is located in their office at time of visit.  Call ended at 1510  I discussed the limitations, risks, security and privacy concerns of performing an evaluation and management service by telephone and the availability of in person appointments. I also discussed with the patient that there may be a patient responsible charge related to this service. The patient expressed understanding and agreed to proceed.   History and Present Illness: Patient is calling in with complaints of being ill. He is having a lot of coughing and congestion.  He had vomiting 2 weeks ago. He is feeling feverish.  He is taking mucinex and is not helping.  His cough has been going on for 9 days and is not clearing. He denies sick contacts.  His coughing is slightly improved and he can't get a deep breath. He has 4 days of waking up in a sweat.    No diagnosis found.  Outpatient Encounter Medications as of 03/29/2020  Medication Sig  . aspirin 81 MG tablet Take 81 mg by mouth daily.  . clobetasol (OLUX) 0.05 % topical foam Apply topically 2 (two) times daily. x7-10 days to scalp per psoriasis flare  . fenofibrate (TRICOR) 145 MG tablet Take 1 tablet (145 mg total) by mouth daily.  . fish oil-omega-3 fatty acids 1000 MG capsule Take 2 g by mouth daily.  . Flaxseed, Linseed, (FLAX SEEDS PO) Take 2 tablets by mouth every morning.   . halobetasol (ULTRAVATE) 0.05 % cream Apply topically 2 (two) times daily. x7-10 days per psoriasis flare  . metoprolol tartrate (LOPRESSOR) 100 MG tablet Take 0.5 tablets (50 mg total) by mouth 2 (two) times daily.  [name] omeprazole (PRILOSEC) 20 MG capsule Take 20 mg by mouth daily.  .  polyethylene glycol powder (MIRALAX) 17 GM/SCOOP powder Mix 1 capful in 8 ounce of water and drink daily as needed for constipation.  . pramipexole (MIRAPEX) 0.25 MG tablet Take 1 tablet (0.25 mg total) by mouth 3 (three) times daily. Schedule follow up with Dr Jannifer Franklin   No facility-administered encounter medications on file as of 03/29/2020.    Review of Systems  Constitutional: Positive for fever. Negative for chills.  HENT: Positive for congestion, postnasal drip, rhinorrhea, sinus pressure, sneezing and sore throat. Negative for ear discharge, ear pain and voice change.   Eyes: Negative for pain, discharge, redness and visual disturbance.  Respiratory: Positive for cough and shortness of breath. Negative for wheezing.   Cardiovascular: Negative for chest pain and leg swelling.  Musculoskeletal: Negative for gait problem.  Skin: Negative for rash.  All other systems reviewed and are negative.   Observations/Objective: Patient sounds comfortable and in no acute distress, coughing  Assessment and Plan: Problem List Items Addressed This Visit    None    Visit Diagnoses    Community acquired pneumonia, unspecified laterality    -  Primary   Relevant Medications   amoxicillin-clavulanate (AUGMENTIN) 875-125 MG tablet   albuterol (VENTOLIN HFA) 108 (90 Base) MCG/ACT inhaler   Other Relevant Orders   DG Chest 2 View      Patient is recommended for quarantine and covid testing. Will get CXR and treat for possible pneumonia Follow up  plan: Return if symptoms worsen or fail to improve.     I discussed the assessment and treatment plan with the patient. The patient was provided an opportunity to ask questions and all were answered. The patient agreed with the plan and demonstrated an understanding of the instructions.   The patient was advised to call back or seek an in-person evaluation if the symptoms worsen or if the condition fails to improve as anticipated.  The above assessment  and management plan was discussed with the patient. The patient verbalized understanding of and has agreed to the management plan. Patient is aware to call the clinic if symptoms persist or worsen. Patient is aware when to return to the clinic for a follow-up visit. Patient educated on when it is appropriate to go to the emergency department.    I provided 12 minutes of non-face-to-face time during this encounter.    Nils Pyle, MD

## 2020-07-20 ENCOUNTER — Encounter: Payer: Self-pay | Admitting: Family Medicine

## 2020-07-20 ENCOUNTER — Ambulatory Visit (INDEPENDENT_AMBULATORY_CARE_PROVIDER_SITE_OTHER): Payer: PPO | Admitting: Family Medicine

## 2020-07-20 ENCOUNTER — Other Ambulatory Visit: Payer: Self-pay

## 2020-07-20 VITALS — BP 138/84 | HR 74 | Temp 97.7°F | Ht 69.0 in | Wt 196.0 lb

## 2020-07-20 DIAGNOSIS — I10 Essential (primary) hypertension: Secondary | ICD-10-CM | POA: Diagnosis not present

## 2020-07-20 DIAGNOSIS — G20 Parkinson's disease: Secondary | ICD-10-CM

## 2020-07-20 DIAGNOSIS — E782 Mixed hyperlipidemia: Secondary | ICD-10-CM

## 2020-07-20 DIAGNOSIS — L409 Psoriasis, unspecified: Secondary | ICD-10-CM | POA: Diagnosis not present

## 2020-07-20 MED ORDER — FENOFIBRATE 145 MG PO TABS: 145.0000 mg | ORAL_TABLET | Freq: Every day | ORAL | 3 refills | Status: DC

## 2020-07-20 MED ORDER — METOPROLOL TARTRATE 100 MG PO TABS: 50.0000 mg | ORAL_TABLET | Freq: Two times a day (BID) | ORAL | 3 refills | Status: DC

## 2020-07-20 MED ORDER — OMEPRAZOLE 20 MG PO CPDR: 20.0000 mg | DELAYED_RELEASE_CAPSULE | Freq: Every day | ORAL | 3 refills | Status: AC
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
## 2020-07-20 NOTE — Patient Instructions (Signed)
The dermatologist will call for an appointment for your psoriasis.   If you don't hear back by next Tues, please call and ask for [facility].  You had labs performed today.  You will be contacted with the results of the labs once they are available, usually in the next 3 business days for routine lab work.  If you have an active my chart account, they will be released to your MyChart.  If you prefer to have these labs released to you via telephone, please let us know.  If you had a pap smear or biopsy performed, expect to be contacted in about 7-10 days.

## 2020-07-21 LAB — LIPID PANEL
Chol/HDL Ratio: 4 ratio (ref 0.0–5.0)
Cholesterol, Total: 143 mg/dL (ref 100–199)
HDL: 36 mg/dL — ABNORMAL LOW (ref >39)
LDL Chol Calc (NIH): 85 mg/dL (ref 0–99)
Triglycerides: 124 mg/dL (ref 0–149)
VLDL Cholesterol Cal: 22 mg/dL (ref 5–40)

## 2020-07-21 LAB — BASIC METABOLIC PANEL
BUN/Creatinine Ratio: 18 (ref 10–24)
BUN: 20 mg/dL (ref 8–27)
CO2: 23 mmol/L (ref 20–29)
Calcium: 9.8 mg/dL (ref 8.6–10.2)
Chloride: 102 mmol/L (ref 96–106)
Creatinine, Ser: 1.12 mg/dL (ref 0.76–1.27)
GFR calc Af Amer: 74 mL/min/{1.73_m2} (ref >59)
GFR calc non Af Amer: 64 mL/min/{1.73_m2} (ref >59)
Glucose: 86 mg/dL (ref 65–99)
Potassium: 4.4 mmol/L (ref 3.5–5.2)
Sodium: 140 mmol/L (ref 134–144)

## 2021-01-21 ENCOUNTER — Ambulatory Visit (INDEPENDENT_AMBULATORY_CARE_PROVIDER_SITE_OTHER): Payer: PPO | Admitting: Family Medicine

## 2021-01-21 ENCOUNTER — Other Ambulatory Visit: Payer: Self-pay

## 2021-01-21 ENCOUNTER — Encounter: Payer: Self-pay | Admitting: Family Medicine

## 2021-01-21 VITALS — BP 134/82 | HR 92 | Temp 97.9°F | Ht 69.0 in | Wt 191.0 lb

## 2021-01-21 DIAGNOSIS — K219 Gastro-esophageal reflux disease without esophagitis: Secondary | ICD-10-CM | POA: Diagnosis not present

## 2021-01-21 DIAGNOSIS — Z125 Encounter for screening for malignant neoplasm of prostate: Secondary | ICD-10-CM

## 2021-01-21 DIAGNOSIS — Z23 Encounter for immunization: Secondary | ICD-10-CM | POA: Diagnosis not present

## 2021-01-21 DIAGNOSIS — G20 Parkinson's disease: Secondary | ICD-10-CM | POA: Diagnosis not present

## 2021-01-21 DIAGNOSIS — E782 Mixed hyperlipidemia: Secondary | ICD-10-CM | POA: Diagnosis not present

## 2021-01-21 DIAGNOSIS — L409 Psoriasis, unspecified: Secondary | ICD-10-CM

## 2021-01-21 DIAGNOSIS — I10 Essential (primary) hypertension: Secondary | ICD-10-CM | POA: Diagnosis not present

## 2021-01-21 MED ORDER — METOPROLOL SUCCINATE ER 50 MG PO TB24: 50.0000 mg | ORAL_TABLET | Freq: Every day | ORAL | 3 refills | Status: DC

## 2021-01-21 MED ORDER — CLOBETASOL PROPIONATE 0.05 % EX FOAM: Freq: Two times a day (BID) | CUTANEOUS | 2 refills | Status: DC

## 2021-01-21 MED ORDER — HALOBETASOL PROPIONATE 0.05 % EX CREA: TOPICAL_CREAM | Freq: Two times a day (BID) | CUTANEOUS | 3 refills | Status: DC
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
## 2021-01-21 NOTE — Patient Instructions (Signed)

## 2021-01-22 LAB — CMP14+EGFR
ALT: 17 IU/L (ref 0–44)
AST: 26 IU/L (ref 0–40)
Albumin/Globulin Ratio: 1.4 (ref 1.2–2.2)
Albumin: 4.2 g/dL (ref 3.7–4.7)
Alkaline Phosphatase: 79 IU/L (ref 44–121)
BUN/Creatinine Ratio: 13 (ref 10–24)
BUN: 16 mg/dL (ref 8–27)
Bilirubin Total: 0.5 mg/dL (ref 0.0–1.2)
CO2: 21 mmol/L (ref 20–29)
Calcium: 9.5 mg/dL (ref 8.6–10.2)
Chloride: 102 mmol/L (ref 96–106)
Creatinine, Ser: 1.23 mg/dL (ref 0.76–1.27)
GFR calc Af Amer: 66 mL/min/{1.73_m2} (ref >59)
GFR calc non Af Amer: 57 mL/min/{1.73_m2} — ABNORMAL LOW (ref >59)
Globulin, Total: 2.9 g/dL (ref 1.5–4.5)
Glucose: 87 mg/dL (ref 65–99)
Potassium: 4.2 mmol/L (ref 3.5–5.2)
Sodium: 139 mmol/L (ref 134–144)
Total Protein: 7.1 g/dL (ref 6.0–8.5)

## 2021-01-22 LAB — LIPID PANEL
Chol/HDL Ratio: 3.9 ratio (ref 0.0–5.0)
Cholesterol, Total: 131 mg/dL (ref 100–199)
HDL: 34 mg/dL — ABNORMAL LOW (ref >39)
LDL Chol Calc (NIH): 77 mg/dL (ref 0–99)
Triglycerides: 107 mg/dL (ref 0–149)
VLDL Cholesterol Cal: 20 mg/dL (ref 5–40)

## 2021-01-22 LAB — TSH: TSH: 2.73 u[IU]/mL (ref 0.450–4.500)

## 2021-01-22 LAB — PSA: Prostate Specific Ag, Serum: 0.7 ng/mL (ref 0.0–4.0)

## 2021-01-27 ENCOUNTER — Encounter: Payer: Self-pay | Admitting: Family Medicine

## 2021-04-29 ENCOUNTER — Other Ambulatory Visit: Payer: Self-pay | Admitting: *Deleted

## 2021-04-29 DIAGNOSIS — E782 Mixed hyperlipidemia: Secondary | ICD-10-CM

## 2021-04-29 MED ORDER — FENOFIBRATE 145 MG PO TABS: 145.0000 mg | ORAL_TABLET | Freq: Every day | ORAL | 2 refills | Status: DC

## 2021-04-29 MED ORDER — METOPROLOL SUCCINATE ER 50 MG PO TB24
50.0000 mg | ORAL_TABLET | Freq: Every day | ORAL | 2 refills | Status: DC
Start: 2021-04-29 — End: 2022-01-04

## 2021-05-11 DIAGNOSIS — H25813 Combined forms of age-related cataract, bilateral: Secondary | ICD-10-CM | POA: Diagnosis not present

## 2021-05-11 DIAGNOSIS — Z0100 Encounter for examination of eyes and vision without abnormal findings: Secondary | ICD-10-CM | POA: Diagnosis not present

## 2021-05-11 DIAGNOSIS — H5200 Hypermetropia, unspecified eye: Secondary | ICD-10-CM | POA: Diagnosis not present

## 2021-05-11 DIAGNOSIS — E7800 Pure hypercholesterolemia, unspecified: Secondary | ICD-10-CM | POA: Diagnosis not present

## 2021-07-22 ENCOUNTER — Other Ambulatory Visit: Payer: Self-pay

## 2021-07-22 ENCOUNTER — Encounter: Payer: Self-pay | Admitting: Family Medicine

## 2021-07-22 ENCOUNTER — Ambulatory Visit (INDEPENDENT_AMBULATORY_CARE_PROVIDER_SITE_OTHER): Payer: Medicare HMO | Admitting: Family Medicine

## 2021-07-22 VITALS — BP 136/85 | HR 97 | Temp 98.5°F | Ht 69.0 in | Wt 186.8 lb

## 2021-07-22 DIAGNOSIS — I10 Essential (primary) hypertension: Secondary | ICD-10-CM | POA: Diagnosis not present

## 2021-07-22 DIAGNOSIS — L409 Psoriasis, unspecified: Secondary | ICD-10-CM | POA: Diagnosis not present

## 2021-07-22 DIAGNOSIS — E782 Mixed hyperlipidemia: Secondary | ICD-10-CM | POA: Diagnosis not present

## 2021-07-22 DIAGNOSIS — G20 Parkinson's disease: Secondary | ICD-10-CM | POA: Diagnosis not present
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
## 2021-07-22 NOTE — Patient Instructions (Signed)
You had labs performed today.  You will be contacted with the results of the labs once they are available, usually in the next 3 business days for routine lab work.  If you have an active my chart account, they will be released to your MyChart.  If you prefer to have these labs released to you via telephone, please let us know.   Psoriasis Psoriasis is a long-term (chronic) skin condition. It occurs because your body's defense system (immune system) causes skin cells to form too quickly. This causes raised, red patches (plaques) on your skin that look silvery. The patches may be on all areas of your body.They can be any size or shape. Psoriasis can come and go. It can range from mild to very bad. It cannot be passed from one person to another (is not contagious). There is no cure for this condition, but it can be helped with treatment. What are the causes? The cause of psoriasis is not known. Some things can make it worse. These are: Skin damage, such as cuts, scrapes, sunburn, and dryness. Not getting enough sunlight. Some medicines. Alcohol. Tobacco. Stress. Infections. What increases the risk? Having a family member with psoriasis. Being very overweight (obese). Being 80-92 years old. Taking certain medicines. What are the signs or symptoms? There are different types of psoriasis. The types are: Plaque. This is the most common. Symptoms include red, raised patches with a silvery coating. These may be itchy. Your nails may be crumbly or fall off. Guttate. Symptoms include small red spots on your stomach area, arms, and legs. These may happen after you have been sick, such as with strep throat. Inverse. Symptoms include patches in your armpits, under your breasts, private areas, or on your butt. Pustular. Symptoms include pus-filled bumps on the palms of your hands or the soles of your feet. You also may feel very tired, weak, have a fever, and not be hungry. Erythrodermic. Symptoms  include bright red skin that looks burned. You may have a fast heartbeat and a body temperature that is too high or too low. You may be itchy or in pain. Sebopsoriasis. Symptoms include red patches on your scalp, forehead, and face that are greasy. Psoriatic arthritis. Symptoms include swollen, painful joints along with scaly skin patches. How is this treated? There is no cure for this condition, but treatment can: Help your skin heal. Lessen itching and irritation and swelling (inflammation). Slow the growth of new skin cells. Help your body's defense system respond better to your skin. Treatment may include: Creams or ointments. Light therapy. This may include natural sunlight or light therapy in a doctor's office. Medicines. These can help your body better manage skin cells. They may be used with light therapy or ointments. Medicines may include pills or injections. You may also get antibiotic medicines if you have an infection. Follow these instructions at home: Skin Care Apply lotion to your skin as needed. Only use those that your doctor has said are okay. Apply cool, wet cloths (cold compresses) to the affected areas. Do not use a hot tub or take hot showers. Use slightly warm, not hot, water when taking showers and baths. Do not scratch your skin. Lifestyle  Do not use any products that contain nicotine or tobacco, such as cigarettes, e-cigarettes, and chewing tobacco. If you need help quitting, ask your doctor. Lower your stress. Keep a healthy weight. Go out in the sun as told by your doctor. Do not get sunburned. Join a support  group.  Medicines Take or use over-the-counter and prescription medicines only as told by your doctor. If you were prescribed an antibiotic medicine, take it as told by your doctor. Do not stop using the antibiotic even if you start to feel better. Alcohol use If you drink alcohol: Limit how much you use: 0-1 drink a day for women. 0-2 drinks a day  for men. Be aware of how much alcohol is in your drink. In the [place] one drink equals one 12 oz bottle of beer (355 mL), one 5 oz glass of wine (148 mL), or one 1 oz glass of hard liquor (44 mL). General instructions Keep a journal to track the things that cause symptoms (triggers). Try to avoid these things. See a counselor if you feel the support would help. Keep all follow-up visits as told by your doctor. This is important. Contact a doctor if: You have a fever. Your pain gets worse. You have more redness or warmth in the affected areas. You have new or worse pain or stiffness in your joints. Your nails start to break easily or pull away from the nail bed. You feel very sad (depressed). Summary Psoriasis is a long-term (chronic) skin condition. There is no cure for this condition, but treatment can help manage it. Keep a journal to track the things that cause symptoms. Take or use over-the-counter and prescription medicines only as told by your doctor. Keep all follow-up visits as told by your doctor. This is important. This information is not intended to replace advice given to you by your health care provider. Make sure you discuss any questions you have with your healthcare provider. Document Revised: 09/17/2018 Document Reviewed: 09/17/2018 Elsevier Patient Education  2022 [employer].

## 2021-07-23 LAB — LIPID PANEL
Chol/HDL Ratio: 4 ratio (ref 0.0–5.0)
Cholesterol, Total: 147 mg/dL (ref 100–199)
HDL: 37 mg/dL — ABNORMAL LOW (ref >39)
LDL Chol Calc (NIH): 92 mg/dL (ref 0–99)
Triglycerides: 94 mg/dL (ref 0–149)
VLDL Cholesterol Cal: 18 mg/dL (ref 5–40)

## 2021-07-23 LAB — CBC
Hematocrit: 44.8 % (ref 37.5–51.0)
Hemoglobin: 15 g/dL (ref 13.0–17.7)
MCH: 31.3 pg (ref 26.6–33.0)
MCHC: 33.5 g/dL (ref 31.5–35.7)
MCV: 93 fL (ref 79–97)
Platelets: 195 10*3/uL (ref 150–450)
RBC: 4.8 x10E6/uL (ref 4.14–5.80)
RDW: 13 % (ref 11.6–15.4)
WBC: 4.1 10*3/uL (ref 3.4–10.8)

## 2021-08-03 ENCOUNTER — Encounter: Payer: Self-pay | Admitting: Family Medicine

## 2021-10-03 ENCOUNTER — Ambulatory Visit (INDEPENDENT_AMBULATORY_CARE_PROVIDER_SITE_OTHER): Payer: Medicare HMO | Admitting: Nurse Practitioner

## 2021-10-03 ENCOUNTER — Other Ambulatory Visit: Payer: Self-pay

## 2021-10-03 ENCOUNTER — Encounter: Payer: Self-pay | Admitting: Nurse Practitioner

## 2021-10-03 VITALS — BP 117/70 | HR 76 | Temp 98.0°F | Resp 20 | Ht 69.0 in | Wt 176.0 lb

## 2021-10-03 DIAGNOSIS — R079 Chest pain, unspecified: Secondary | ICD-10-CM | POA: Diagnosis not present

## 2021-10-03 NOTE — Progress Notes (Signed)
   Subjective:    Patient ID: Randy [name], male    DOB: 1946/07/09, 75 y.o.   MRN: [id number]  Chief Complaint: Chest Pain   HPI Patient comes in today C/O chest pain intermittently  for over  a month. Pain is sharp pain. Can last a couple of hours. Happens some with activity and some times when he is just seating around. He denies any shortness of breath. He has never seen a cardiologist. He denies family history of heart problems.  Not a good historian with regards to chest pain. Trouble describing pain and how long it lasts.  Review of Systems  Constitutional:  Negative for diaphoresis.  Eyes:  Negative for pain.  Respiratory:  Negative for shortness of breath.   Cardiovascular:  Negative for chest pain, palpitations and leg swelling.  Gastrointestinal:  Negative for abdominal pain.  Endocrine: Negative for polydipsia.  Skin:  Negative for rash.  Neurological:  Negative for dizziness, weakness and headaches.  Hematological:  Does not bruise/bleed easily.  All other systems reviewed and are negative.     Objective:   Physical Exam Vitals and nursing note reviewed.  Constitutional:      Appearance: He is well-developed.  Cardiovascular:     Rate and Rhythm: Normal rate and regular rhythm.     Pulses:          Carotid pulses are 2+ on the right side and 2+ on the left side.      Radial pulses are 2+ on the right side and 2+ on the left side.       Posterior tibial pulses are 2+ on the right side and 2+ on the left side.     Heart sounds: Normal heart sounds. No murmur heard. Pulmonary:     Effort: Pulmonary effort is normal.     Breath sounds: Normal breath sounds.  Abdominal:     General: Bowel sounds are normal.     Palpations: Abdomen is soft.  Skin:    General: Skin is warm and dry.  Neurological:     General: No focal deficit present.     Mental Status: He is alert.     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        Behavior: Behavior normal.   BP 117/70   Pulse 76   Temp 98 F (36.7 C) (Temporal)   Resp 20   Ht 5\' 9"  (1.753 m)   Wt 176 lb (79.8 kg)   SpO2 98%   BMI 25.99 kg/m   EKG- NSR-Mary-Margaret , FNP        Assessment & Plan:   Randy [name] in today with chief complaint of Chest Pain   1. Chest pain, unspecified type Avoid strenuous activity Keep diary of chest pain - EKG 12-Lead - Ambulatory referral to Cardiology    The above assessment and management plan was discussed with the patient. The patient verbalized understanding of and has agreed to the management plan. Patient is aware to call the clinic if symptoms persist or worsen. Patient is aware when to return to the clinic for a follow-up visit. Patient educated on when it is appropriate to go to the emergency department.   Mary-Margaret Randy Piccolo, FNP

## 2021-10-03 NOTE — Patient Instructions (Signed)
Chest Wall Pain °Chest wall pain is pain in or around the bones and muscles of your chest. Chest wall pain may be caused by: °An injury. °Coughing a lot. °Using your chest and arm muscles too much. °Sometimes, the cause may not be known. This pain may take a few weeks or longer to get better. °Follow these instructions at home: °Managing pain, stiffness, and swelling °If told, put ice on the painful area: °Put ice in a plastic bag. °Place a towel between your skin and the bag. °Leave the ice on for 20 minutes, 2-3 times a day. ° °Activity °Rest as told by your doctor. °Avoid doing things that cause pain. This includes lifting heavy items. °Ask your doctor what activities are safe for you. °General instructions ° °Take over-the-counter and prescription medicines only as told by your doctor. °Do not use any products that contain nicotine or tobacco, such as cigarettes, e-cigarettes, and chewing tobacco. If you need help quitting, ask your doctor. °Keep all follow-up visits as told by your doctor. This is important. °Contact a doctor if: °You have a fever. °Your chest pain gets worse. °You have new symptoms. °Get help right away if: °You feel sick to your stomach (nauseous) or you throw up (vomit). °You feel sweaty or light-headed. °You have a cough with mucus from your lungs (sputum) or you cough up blood. °You are short of breath. °These symptoms may be an emergency. Do not wait to see if the symptoms will go away. Get medical help right away. Call your local emergency services (911 in the [place] Do not drive yourself to the hospital. °Summary °Chest wall pain is pain in or around the bones and muscles of your chest. °It may be treated with ice, rest, and medicines. Your condition may also get better if you avoid doing things that cause pain. °Contact a doctor if you have a fever, chest pain that gets worse, or new symptoms. °Get help right away if you feel light-headed or you get short of breath. These symptoms may  be an emergency. °This information is not intended to replace advice given to you by your health care provider. Make sure you discuss any questions you have with your health care provider. °Document Revised: 02/15/2021 Document Reviewed: 01/28/2021 °Elsevier Patient Education © 2022 Elsevier Inc. ° °

## 2021-10-04 ENCOUNTER — Ambulatory Visit: Payer: Medicare HMO | Admitting: Nurse Practitioner

## 2021-11-08 ENCOUNTER — Ambulatory Visit (INDEPENDENT_AMBULATORY_CARE_PROVIDER_SITE_OTHER): Payer: Medicare HMO | Admitting: Nurse Practitioner

## 2021-11-08 ENCOUNTER — Encounter: Payer: Self-pay | Admitting: Nurse Practitioner

## 2021-11-08 ENCOUNTER — Ambulatory Visit (INDEPENDENT_AMBULATORY_CARE_PROVIDER_SITE_OTHER): Payer: Medicare HMO

## 2021-11-08 VITALS — BP 137/85 | HR 77 | Temp 97.3°F | Ht 69.0 in | Wt 182.0 lb

## 2021-11-08 DIAGNOSIS — W19XXXA Unspecified fall, initial encounter: Secondary | ICD-10-CM | POA: Diagnosis not present

## 2021-11-08 DIAGNOSIS — R0781 Pleurodynia: Secondary | ICD-10-CM | POA: Diagnosis not present

## 2021-11-08 DIAGNOSIS — S2242XA Multiple fractures of ribs, left side, initial encounter for closed fracture: Secondary | ICD-10-CM | POA: Diagnosis not present

## 2021-11-08 MED ORDER — IBUPROFEN 600 MG PO TABS
600.0000 mg | ORAL_TABLET | Freq: Three times a day (TID) | ORAL | 0 refills | Status: DC | PRN
Start: 2021-11-08 — End: 2022-02-13
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
## 2021-11-08 NOTE — Assessment & Plan Note (Signed)
Completed full assessment.  Education provided on fall prevention printed handouts given.

## 2021-11-08 NOTE — Assessment & Plan Note (Signed)
Patient will watch 2 weeks ago.  Symptoms gradually improving.  Patient reports mild discomfort in left rib area.  Completed x-ray to rule out rib fracture.  Anti-inflammatory(ibuprofen as needed for pain and discomfort.  Warm or cold compress as tolerated. Guard when breathing, education provided to patient with printed hand out given. Follow-up with worsening unresolved symptoms.

## 2021-11-08 NOTE — Patient Instructions (Signed)
Chest Wall Pain Chest wall pain is pain in or around the bones and muscles of your chest. Chest wall pain may be caused by: An injury. Coughing a lot. Using your chest and arm muscles too much. Sometimes, the cause may not be known. This pain may take a few weeks or longer to get better. Follow these instructions at home: Managing pain, stiffness, and swelling If told, put ice on the painful area: Put ice in a plastic bag. Place a towel between your skin and the bag. Leave the ice on for 20 minutes, 2-3 times a day.  Activity Rest as told by your doctor. Avoid doing things that cause pain. This includes lifting heavy items. Ask your doctor what activities are safe for you. General instructions  Take over-the-counter and prescription medicines only as told by your doctor. Do not use any products that contain nicotine or tobacco, such as cigarettes, e-cigarettes, and chewing tobacco. If you need help quitting, ask your doctor. Keep all follow-up visits as told by your doctor. This is important. Contact a doctor if: You have a fever. Your chest pain gets worse. You have new symptoms. Get help right away if: You feel sick to your stomach (nauseous) or you throw up (vomit). You feel sweaty or light-headed. You have a cough with mucus from your lungs (sputum) or you cough up blood. You are short of breath. These symptoms may be an emergency. Do not wait to see if the symptoms will go away. Get medical help right away. Call your local emergency services (911 in the [place] Do not drive yourself to the hospital. Summary Chest wall pain is pain in or around the bones and muscles of your chest. It may be treated with ice, rest, and medicines. Your condition may also get better if you avoid doing things that cause pain. Contact a doctor if you have a fever, chest pain that gets worse, or new symptoms. Get help right away if you feel light-headed or you get short of breath. These symptoms may  be an emergency. This information is not intended to replace advice given to you by your health care provider. Make sure you discuss any questions you have with your health care provider. Document Revised: 02/15/2021 Document Reviewed: 01/28/2021 Elsevier Patient Education  2022 [employer]. Fall Prevention in the Home, Adult Falls can cause injuries and can happen to people of all ages. There are many things you can do to make your home safe and to help prevent falls. Ask for help when making these changes. What actions can I take to prevent falls? General Instructions Use good lighting in all rooms. Replace any light bulbs that burn out. Turn on the lights in dark areas. Use night-lights. Keep items that you use often in easy-to-reach places. Lower the shelves around your home if needed. Set up your furniture so you have a clear path. Avoid moving your furniture around. Do not have throw rugs or other things on the floor that can make you trip. Avoid walking on wet floors. If any of your floors are uneven, fix them. Add color or contrast paint or tape to clearly mark and help you see: Grab bars or handrails. First and last steps of staircases. Where the edge of each step is. If you use a stepladder: Make sure that it is fully opened. Do not climb a closed stepladder. Make sure the sides of the stepladder are locked in place. Ask someone to hold the stepladder while you use  it. Know where your pets are when moving through your home. What can I do in the bathroom?   Keep the floor dry. Clean up any water on the floor right away. Remove soap buildup in the tub or shower. Use nonskid mats or decals on the floor of the tub or shower. Attach bath mats securely with double-sided, nonslip rug tape. If you need to sit down in the shower, use a plastic, nonslip stool. Install grab bars by the toilet and in the tub and shower. Do not use towel bars as grab bars. What can I do in the  bedroom? Make sure that you have a light by your bed that is easy to reach. Do not use any sheets or blankets for your bed that hang to the floor. Have a firm chair with side arms that you can use for support when you get dressed. What can I do in the kitchen? Clean up any spills right away. If you need to reach something above you, use a step stool with a grab bar. Keep electrical cords out of the way. Do not use floor polish or wax that makes floors slippery. What can I do with my stairs? Do not leave any items on the stairs. Make sure that you have a light switch at the top and the bottom of the stairs. Make sure that there are handrails on both sides of the stairs. Fix handrails that are broken or loose. Install nonslip stair treads on all your stairs. Avoid having throw rugs at the top or bottom of the stairs. Choose a carpet that does not hide the edge of the steps on the stairs. Check carpeting to make sure that it is firmly attached to the stairs. Fix carpet that is loose or worn. What can I do on the outside of my home? Use bright outdoor lighting. Fix the edges of walkways and driveways and fix any cracks. Remove anything that might make you trip as you walk through a door, such as a raised step or threshold. Trim any bushes or trees on paths to your home. Check to see if handrails are loose or broken and that both sides of all steps have handrails. Install guardrails along the edges of any raised decks and porches. Clear paths of anything that can make you trip, such as tools or rocks. Have leaves, snow, or ice cleared regularly. Use sand or salt on paths during winter. Clean up any spills in your garage right away. This includes grease or oil spills. What other actions can I take? Wear shoes that: Have a low heel. Do not wear high heels. Have rubber bottoms. Feel good on your feet and fit well. Are closed at the toe. Do not wear open-toe sandals. Use tools that help you  move around if needed. These include: Canes. Walkers. Scooters. Crutches. Review your medicines with your doctor. Some medicines can make you feel dizzy. This can increase your chance of falling. Ask your doctor what else you can do to help prevent falls. Where to find more information Centers for Disease Control and Prevention, [employer]: FootballExhibition.com.br [employer] on Aging: https://walker.com/ Contact a doctor if: You are afraid of falling at home. You feel weak, drowsy, or dizzy at home. You fall at home. Summary There are many simple things that you can do to make your home safe and to help prevent falls. Ways to make your home safe include removing things that can make you trip and installing grab bars in  the bathroom. Ask for help when making these changes in your home. This information is not intended to replace advice given to you by your health care provider. Make sure you discuss any questions you have with your health care provider. Document Revised: 06/16/2020 Document Reviewed: 06/16/2020 Elsevier Patient Education  2022 [employer].

## 2021-11-11 ENCOUNTER — Other Ambulatory Visit: Payer: Self-pay

## 2021-11-11 ENCOUNTER — Ambulatory Visit: Payer: Medicare HMO | Admitting: Cardiovascular Disease

## 2021-11-11 ENCOUNTER — Encounter: Payer: Self-pay | Admitting: Cardiovascular Disease

## 2021-11-11 VITALS — BP 118/70 | HR 77 | Ht 69.0 in | Wt 181.0 lb

## 2021-11-11 DIAGNOSIS — R0789 Other chest pain: Secondary | ICD-10-CM

## 2021-11-11 DIAGNOSIS — Z8249 Family history of ischemic heart disease and other diseases of the circulatory system: Secondary | ICD-10-CM

## 2021-11-11 DIAGNOSIS — E782 Mixed hyperlipidemia: Secondary | ICD-10-CM

## 2021-11-11 DIAGNOSIS — I10 Essential (primary) hypertension: Secondary | ICD-10-CM | POA: Diagnosis not present

## 2021-11-11 NOTE — Progress Notes (Signed)
11/11/2021 Jayzon R Heinicke   [birth year]  [id number]  Primary Physician [name], DO Primary Cardiologist: Lorretta Harp MD FACP, [facility], [place], [place]  HPI:  JOSEFF LUCKMAN is a 75 y.o.  moderately overweight, married Caucasian male, father of 2 without I last saw in the office 12/22/2015.[name]  He is accompanied by his wife [name] today.  He has risk factors that include hypertension, hyperlipidemia . He had a Myoview stress test that was normal except for an abnormal electrocardiographic response to exercise and underwent MET-testing that was normal as well.   Since I saw him 5 years ago he has developed Parkinson's disease and has a tremor.  Apparently he has seen a neurologist but has not pursued therapy for this.  He had one episode of chest pain about 6 weeks ago and apparently fractured 3 of his ribs on the left side.  He said no recurrent symptoms.  Current Meds  Medication Sig   aspirin 81 MG tablet Take 81 mg by mouth daily.   calcium-vitamin D (OSCAL WITH D) 500-200 MG-UNIT tablet Take 1 tablet by mouth.   fenofibrate (TRICOR) 145 MG tablet Take 1 tablet (145 mg total) by mouth daily.   ibuprofen (ADVIL) 600 MG tablet Take 1 tablet (600 mg total) by mouth every 8 (eight) hours as needed.   metoprolol succinate (TOPROL-XL) 50 MG 24 hr tablet Take 1 tablet (50 mg total) by mouth daily. Take with or immediately following a meal. (to replace metoprolol tartrate)   omeprazole (PRILOSEC) 20 MG capsule Take 1 capsule (20 mg total) by mouth daily.     No Known Allergies  Social History   Socioeconomic History   Marital status: Married    Spouse name: [name]   Number of children: 2   Years of education: 12   Highest education level: High school graduate  Occupational History   Occupation: Retired  Tobacco Use   Smoking status: Never   Smokeless tobacco: Never  Scientific laboratory technician Use: Never used  Substance and Sexual Activity   Alcohol use: Not Currently    Drug use: No   Sexual activity: Not Currently    Birth control/protection: None  Other Topics Concern   Not on file  Social History Narrative   Lives with wife   Caffeine use:  Diet coke/pepso   Right handed    Social Determinants of Health   Financial Resource Strain: Not on file  Food Insecurity: Not on file  Transportation Needs: Not on file  Physical Activity: Not on file  Stress: Not on file  Social Connections: Not on file  Intimate Partner Violence: Not on file     Review of Systems: General: negative for chills, fever, night sweats or weight changes.  Cardiovascular: negative for chest pain, dyspnea on exertion, edema, orthopnea, palpitations, paroxysmal nocturnal dyspnea or shortness of breath Dermatological: negative for rash Respiratory: negative for cough or wheezing Urologic: negative for hematuria Abdominal: negative for nausea, vomiting, diarrhea, bright red blood per rectum, melena, or hematemesis Neurologic: negative for visual changes, syncope, or dizziness All other systems reviewed and are otherwise negative except as noted above.    Blood pressure 118/70, pulse 77, height '5\' 9"'  (1.753 m), weight 181 lb (82.1 kg), SpO2 96 %.  General appearance: alert and no distress Neck: no adenopathy, no carotid bruit, no JVD, supple, symmetrical, trachea midline, and thyroid not enlarged, symmetric, no tenderness/mass/nodules Lungs: clear to auscultation bilaterally Heart: regular rate and  rhythm, S1, S2 normal, no murmur, click, rub or gallop Extremities: extremities normal, atraumatic, no cyanosis or edema Pulses: 2+ and symmetric Skin: Skin color, texture, turgor normal. No rashes or lesions Neurologic: Grossly normal  EKG sinus rhythm at 77 without ST or T wave changes.  I personally reviewed this EKG.  ASSESSMENT AND PLAN:   Essential hypertension History of essential hypertension a blood pressure measured today at 118/70.  He is on  metoprolol.  Hyperlipidemia History of hyperlipidemia on fenofibrate with lipid profile performed 07/22/2021 revealing a total cholesterol of 147, LDL of 92 and HDL 37  Atypical chest pain History of atypical chest pain with negative Myoview 05/23/2012.  He apparently also fractured several ribs on the left side.  His symptoms have not recurred over the last 6 weeks.  I doubt that they are anginal.  I am going to get a coronary calcium score to further evaluate     Lorretta Harp MD [facility], [facility] 11/11/2021 11:17 AM

## 2021-11-11 NOTE — Assessment & Plan Note (Signed)
History of essential hypertension a blood pressure measured today at 118/70.  He is on metoprolol.

## 2021-11-11 NOTE — Patient Instructions (Signed)
Medication Instructions:  Your physician recommends that you continue on your current medications as directed. Please refer to the Current Medication list given to you today.  *If you need a refill on your cardiac medications before your next appointment, please call your pharmacy*   Testing/Procedures: Dr. Berry has ordered a CT coronary calcium score. This test is done at [address]. This is $99 out of pocket.   Coronary CalciumScan A coronary calcium scan is an imaging test used to look for deposits of calcium and other fatty materials (plaques) in the inner lining of the blood vessels of the heart (coronary arteries). These deposits of calcium and plaques can partly clog and narrow the coronary arteries without producing any symptoms or warning signs. This puts a person at risk for a heart attack. This test can detect these deposits before symptoms develop. Tell a health care provider about: Any allergies you have. All medicines you are taking, including vitamins, herbs, eye drops, creams, and over-the-counter medicines. Any problems you or family members have had with anesthetic medicines. Any blood disorders you have. Any surgeries you have had. Any medical conditions you have. Whether you are pregnant or may be pregnant. What are the risks? Generally, this is a safe procedure. However, problems may occur, including: Harm to a pregnant woman and her unborn baby. This test involves the use of radiation. Radiation exposure can be dangerous to a pregnant woman and her unborn baby. If you are pregnant, you generally should not have this procedure done. Slight increase in the risk of cancer. This is because of the radiation involved in the test. What happens before the procedure? No preparation is needed for this procedure. What happens during the procedure? You will undress and remove any jewelry around your neck or chest. You will put on a hospital gown. Sticky  electrodes will be placed on your chest. The electrodes will be connected to an electrocardiogram (ECG) machine to record a tracing of the electrical activity of your heart. A CT scanner will take pictures of your heart. During this time, you will be asked to lie still and hold your breath for 2-3 seconds while a picture of your heart is being taken. The procedure may vary among health care providers and hospitals. What happens after the procedure? You can get dressed. You can return to your normal activities. It is up to you to get the results of your test. Ask your health care provider, or the department that is doing the test, when your results will be ready. Summary A coronary calcium scan is an imaging test used to look for deposits of calcium and other fatty materials (plaques) in the inner lining of the blood vessels of the heart (coronary arteries). Generally, this is a safe procedure. Tell your health care provider if you are pregnant or may be pregnant. No preparation is needed for this procedure. A CT scanner will take pictures of your heart. You can return to your normal activities after the scan is done. This information is not intended to replace advice given to you by your health care provider. Make sure you discuss any questions you have with your health care provider. Document Released: 05/11/2008 Document Revised: 10/02/2016 Document Reviewed: 10/02/2016 Elsevier Interactive Patient Education  2017 Elsevier Inc.    Follow-Up: At [facility], you and your health needs are our priority.  As part of our continuing mission to provide you with exceptional heart care, we have created designated Provider   Care Teams.  These Care Teams include your primary Cardiologist (physician) and Advanced Practice Providers (APPs -  Physician Assistants and Nurse Practitioners) who all work together to provide you with the care you need, when you need it.  We recommend signing up for the  patient portal called "MyChart".  Sign up information is provided on this After Visit Summary.  MyChart is used to connect with patients for Virtual Visits (Telemedicine).  Patients are able to view lab/test results, encounter notes, upcoming appointments, etc.  Non-urgent messages can be sent to your provider as well.   To learn more about what you can do with MyChart, go to [email or web address].    Your next appointment:   We will see you on an as needed basis.  Provider:   Jonathan Berry, MD 

## 2021-11-11 NOTE — Assessment & Plan Note (Signed)
History of hyperlipidemia on fenofibrate with lipid profile performed 07/22/2021 revealing a total cholesterol of 147, LDL of 92 and HDL 37

## 2021-11-11 NOTE — Assessment & Plan Note (Signed)
History of atypical chest pain with negative Myoview 05/23/2012.  He apparently also fractured several ribs on the left side.  His symptoms have not recurred over the last 6 weeks.  I doubt that they are anginal.  I am going to get a coronary calcium score to further evaluate

## 2021-12-20 ENCOUNTER — Ambulatory Visit (INDEPENDENT_AMBULATORY_CARE_PROVIDER_SITE_OTHER)
Admission: RE | Admit: 2021-12-20 | Discharge: 2021-12-20 | Disposition: A | Discharge status: Home / Self Care | Payer: Self-pay | Source: Ambulatory Visit | Attending: Cardiovascular Disease | Admitting: Cardiovascular Disease

## 2021-12-20 ENCOUNTER — Other Ambulatory Visit: Payer: Self-pay

## 2021-12-20 DIAGNOSIS — E782 Mixed hyperlipidemia: Secondary | ICD-10-CM

## 2022-01-04 ENCOUNTER — Other Ambulatory Visit: Payer: Self-pay | Admitting: Family Medicine

## 2022-01-20 ENCOUNTER — Ambulatory Visit (INDEPENDENT_AMBULATORY_CARE_PROVIDER_SITE_OTHER): Payer: Medicare HMO | Admitting: Family Medicine

## 2022-01-20 ENCOUNTER — Encounter: Payer: Self-pay | Admitting: Family Medicine

## 2022-01-20 VITALS — BP 136/79 | HR 71 | Temp 97.7°F | Ht 69.0 in | Wt 181.6 lb

## 2022-01-20 DIAGNOSIS — Z0001 Encounter for general adult medical examination with abnormal findings: Secondary | ICD-10-CM

## 2022-01-20 DIAGNOSIS — Z0000 Encounter for general adult medical examination without abnormal findings: Secondary | ICD-10-CM

## 2022-01-20 DIAGNOSIS — R351 Nocturia: Secondary | ICD-10-CM | POA: Diagnosis not present

## 2022-01-20 DIAGNOSIS — L729 Follicular cyst of the skin and subcutaneous tissue, unspecified: Secondary | ICD-10-CM

## 2022-01-20 DIAGNOSIS — G20A1 Parkinson's disease without dyskinesia, without mention of fluctuations: Secondary | ICD-10-CM

## 2022-01-20 DIAGNOSIS — E782 Mixed hyperlipidemia: Secondary | ICD-10-CM | POA: Diagnosis not present

## 2022-01-20 DIAGNOSIS — G20 Parkinson's disease: Secondary | ICD-10-CM | POA: Diagnosis not present

## 2022-01-20 DIAGNOSIS — L409 Psoriasis, unspecified: Secondary | ICD-10-CM

## 2022-01-20 DIAGNOSIS — I10 Essential (primary) hypertension: Secondary | ICD-10-CM | POA: Diagnosis not present

## 2022-01-20 MED ORDER — FENOFIBRATE 145 MG PO TABS: 145.0000 mg | ORAL_TABLET | Freq: Every day | ORAL | 3 refills | Status: DC

## 2022-01-20 MED ORDER — METOPROLOL SUCCINATE ER 50 MG PO TB24: ORAL_TABLET | ORAL | 3 refills | Status: DC
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
## 2022-01-20 NOTE — Patient Instructions (Signed)
You had labs performed today.  You will be contacted with the results of the labs once they are available, usually in the next 3 business days for routine lab work.  If you have an active my chart account, they will be released to your MyChart.  If you prefer to have these labs released to you via telephone, please let us know. ° ° Preventive Care 65 Years and Older, Male °Preventive care refers to lifestyle choices and visits with your health care provider that can promote health and wellness. Preventive care visits are also called wellness exams. °What can I expect for my preventive care visit? °Counseling °During your preventive care visit, your health care provider may ask about your: °Medical history, including: °Past medical problems. °Family medical history. °History of falls. °Current health, including: °Emotional well-being. °Home life and relationship well-being. °Sexual activity. °Memory and ability to understand (cognition). °Lifestyle, including: °Alcohol, nicotine or tobacco, and drug use. °Access to firearms. °Diet, exercise, and sleep habits. °Work and work environment. °Sunscreen use. °Safety issues such as seatbelt and bike helmet use. °Physical exam °Your health care provider will check your: °Height and weight. These may be used to calculate your BMI (body mass index). BMI is a measurement that tells if you are at a healthy weight. °Waist circumference. This measures the distance around your waistline. This measurement also tells if you are at a healthy weight and may help predict your risk of certain diseases, such as type 2 diabetes and high blood pressure. °Heart rate and blood pressure. °Body temperature. °Skin for abnormal spots. °What immunizations do I need? °Vaccines are usually given at various ages, according to a schedule. Your health care provider will recommend vaccines for you based on your age, medical history, and lifestyle or other factors, such as travel or where you work. °What  tests do I need? °Screening °Your health care provider may recommend screening tests for certain conditions. This may include: °Lipid and cholesterol levels. °Diabetes screening. This is done by checking your blood sugar (glucose) after you have not eaten for a while (fasting). °Hepatitis C test. °Hepatitis B test. °HIV (human immunodeficiency virus) test. °STI (sexually transmitted infection) testing, if you are at risk. °Lung cancer screening. °Colorectal cancer screening. °Prostate cancer screening. °Abdominal aortic aneurysm (AAA) screening. You may need this if you are a current or former smoker. °Talk with your health care provider about your test results, treatment options, and if necessary, the need for more tests. °Follow these instructions at home: °Eating and drinking ° °Eat a diet that includes fresh fruits and vegetables, whole grains, lean protein, and low-fat dairy products. Limit your intake of foods with high amounts of sugar, saturated fats, and salt. °Take vitamin and mineral supplements as recommended by your health care provider. °Do not drink alcohol if your health care provider tells you not to drink. °If you drink alcohol: °Limit how much you have to 0-2 drinks a day. °Know how much alcohol is in your drink. In the [place] one drink equals one 12 oz bottle of beer (355 mL), one 5 oz glass of wine (148 mL), or one 1½ oz glass of hard liquor (44 mL). °Lifestyle °Brush your teeth every morning and night with fluoride toothpaste. Floss one time each day. °Exercise for at least 30 minutes 5 or more days each week. °Do not use any products that contain nicotine or tobacco. These products include cigarettes, chewing tobacco, and vaping devices, such as e-cigarettes. If you need help quitting,   ask your health care provider. °Do not use drugs. °If you are sexually active, practice safe sex. Use a condom or other form of protection to prevent STIs. °Take aspirin only as told by your health care provider.  Make sure that you understand how much to take and what form to take. Work with your health care provider to find out whether it is safe and beneficial for you to take aspirin daily. °Ask your health care provider if you need to take a cholesterol-lowering medicine (statin). °Find healthy ways to manage stress, such as: °Meditation, yoga, or listening to music. °Journaling. °Talking to a trusted person. °Spending time with friends and family. °Safety °Always wear your seat belt while driving or riding in a vehicle. °Do not drive: °If you have been drinking alcohol. Do not ride with someone who has been drinking. °When you are tired or distracted. °While texting. °If you have been using any mind-altering substances or drugs. °Wear a helmet and other protective equipment during sports activities. °If you have firearms in your house, make sure you follow all gun safety procedures. °Minimize exposure to UV radiation to reduce your risk of skin cancer. °What's next? °Visit your health care provider once a year for an annual wellness visit. °Ask your health care provider how often you should have your eyes and teeth checked. °Stay up to date on all vaccines. °This information is not intended to replace advice given to you by your health care provider. Make sure you discuss any questions you have with your health care provider. °Document Revised: 05/11/2021 Document Reviewed: 05/11/2021 °Elsevier Patient Education © 2022 Elsevier Inc. ° ° °

## 2022-01-21 LAB — CMP14+EGFR
ALT: 17 IU/L (ref 0–44)
AST: 28 IU/L (ref 0–40)
Albumin/Globulin Ratio: 1.8 (ref 1.2–2.2)
Albumin: 4.2 g/dL (ref 3.7–4.7)
Alkaline Phosphatase: 80 IU/L (ref 44–121)
BUN/Creatinine Ratio: 19 (ref 10–24)
BUN: 24 mg/dL (ref 8–27)
Bilirubin Total: 0.7 mg/dL (ref 0.0–1.2)
CO2: 24 mmol/L (ref 20–29)
Calcium: 9.6 mg/dL (ref 8.6–10.2)
Chloride: 101 mmol/L (ref 96–106)
Creatinine, Ser: 1.24 mg/dL (ref 0.76–1.27)
Globulin, Total: 2.4 g/dL (ref 1.5–4.5)
Glucose: 83 mg/dL (ref 70–99)
Potassium: 4.3 mmol/L (ref 3.5–5.2)
Sodium: 137 mmol/L (ref 134–144)
Total Protein: 6.6 g/dL (ref 6.0–8.5)
eGFR: 61 mL/min/{1.73_m2} (ref >59)

## 2022-01-21 LAB — PSA: Prostate Specific Ag, Serum: 0.4 ng/mL (ref 0.0–4.0)

## 2022-01-21 LAB — CBC
Hematocrit: 43.8 % (ref 37.5–51.0)
Hemoglobin: 14.8 g/dL (ref 13.0–17.7)
MCH: 30.9 pg (ref 26.6–33.0)
MCHC: 33.8 g/dL (ref 31.5–35.7)
MCV: 91 fL (ref 79–97)
Platelets: 209 10*3/uL (ref 150–450)
RBC: 4.79 x10E6/uL (ref 4.14–5.80)
RDW: 13.2 % (ref 11.6–15.4)
WBC: 5.2 10*3/uL (ref 3.4–10.8)

## 2022-01-21 LAB — LIPID PANEL
Chol/HDL Ratio: 3.5 ratio (ref 0.0–5.0)
Cholesterol, Total: 130 mg/dL (ref 100–199)
HDL: 37 mg/dL — ABNORMAL LOW (ref >39)
LDL Chol Calc (NIH): 76 mg/dL (ref 0–99)
Triglycerides: 91 mg/dL (ref 0–149)
VLDL Cholesterol Cal: 17 mg/dL (ref 5–40)

## 2022-01-21 LAB — TSH: TSH: 2.39 u[IU]/mL (ref 0.450–4.500)

## 2022-02-13 ENCOUNTER — Encounter: Payer: Self-pay | Admitting: Nurse Practitioner

## 2022-02-13 ENCOUNTER — Ambulatory Visit (INDEPENDENT_AMBULATORY_CARE_PROVIDER_SITE_OTHER): Payer: Medicare HMO

## 2022-02-13 ENCOUNTER — Ambulatory Visit (INDEPENDENT_AMBULATORY_CARE_PROVIDER_SITE_OTHER): Payer: Medicare HMO | Admitting: Nurse Practitioner

## 2022-02-13 VITALS — BP 124/70 | HR 74 | Temp 98.8°F | Ht 69.0 in | Wt 189.0 lb

## 2022-02-13 DIAGNOSIS — R0781 Pleurodynia: Secondary | ICD-10-CM

## 2022-02-13 DIAGNOSIS — S2242XA Multiple fractures of ribs, left side, initial encounter for closed fracture: Secondary | ICD-10-CM | POA: Diagnosis not present

## 2022-02-13 MED ORDER — IBUPROFEN 600 MG PO TABS: 600.0000 mg | ORAL_TABLET | Freq: Three times a day (TID) | ORAL | 0 refills | Status: AC | PRN

## 2022-02-13 NOTE — Patient Instructions (Signed)
Chest Wall Pain °Chest wall pain is pain in or around the bones and muscles of your chest. Chest wall pain may be caused by: °An injury. °Coughing a lot. °Using your chest and arm muscles too much. °Sometimes, the cause may not be known. This pain may take a few weeks or longer to get better. °Follow these instructions at home: °Managing pain, stiffness, and swelling °If told, put ice on the painful area: °Put ice in a plastic bag. °Place a towel between your skin and the bag. °Leave the ice on for 20 minutes, 2-3 times a day. ° °Activity °Rest as told by your doctor. °Avoid doing things that cause pain. This includes lifting heavy items. °Ask your doctor what activities are safe for you. °General instructions ° °Take over-the-counter and prescription medicines only as told by your doctor. °Do not use any products that contain nicotine or tobacco, such as cigarettes, e-cigarettes, and chewing tobacco. If you need help quitting, ask your doctor. °Keep all follow-up visits as told by your doctor. This is important. °Contact a doctor if: °You have a fever. °Your chest pain gets worse. °You have new symptoms. °Get help right away if: °You feel sick to your stomach (nauseous) or you throw up (vomit). °You feel sweaty or light-headed. °You have a cough with mucus from your lungs (sputum) or you cough up blood. °You are short of breath. °These symptoms may be an emergency. Do not wait to see if the symptoms will go away. Get medical help right away. Call your local emergency services (911 in the [place] Do not drive yourself to the hospital. °Summary °Chest wall pain is pain in or around the bones and muscles of your chest. °It may be treated with ice, rest, and medicines. Your condition may also get better if you avoid doing things that cause pain. °Contact a doctor if you have a fever, chest pain that gets worse, or new symptoms. °Get help right away if you feel light-headed or you get short of breath. These symptoms may  be an emergency. °This information is not intended to replace advice given to you by your health care provider. Make sure you discuss any questions you have with your health care provider. °Document Revised: 02/15/2021 Document Reviewed: 01/28/2021 °Elsevier Patient Education © 2022 Elsevier Inc. ° °
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
## 2022-02-15 ENCOUNTER — Other Ambulatory Visit: Payer: Self-pay | Admitting: Family Medicine

## 2022-02-15 DIAGNOSIS — E782 Mixed hyperlipidemia: Secondary | ICD-10-CM

## 2022-02-23 ENCOUNTER — Encounter: Payer: Self-pay | Admitting: Nurse Practitioner

## 2022-02-23 ENCOUNTER — Ambulatory Visit (INDEPENDENT_AMBULATORY_CARE_PROVIDER_SITE_OTHER): Payer: Medicare HMO | Admitting: Nurse Practitioner

## 2022-02-23 VITALS — BP 119/74 | HR 84 | Temp 98.0°F | Resp 20 | Ht 69.0 in | Wt 180.0 lb

## 2022-02-23 DIAGNOSIS — G20A1 Parkinson's disease without dyskinesia, without mention of fluctuations: Secondary | ICD-10-CM

## 2022-02-23 DIAGNOSIS — G20 Parkinson's disease: Secondary | ICD-10-CM | POA: Diagnosis not present

## 2022-02-23 DIAGNOSIS — R300 Dysuria: Secondary | ICD-10-CM | POA: Diagnosis not present

## 2022-02-23 DIAGNOSIS — N3001 Acute cystitis with hematuria: Secondary | ICD-10-CM | POA: Diagnosis not present

## 2022-02-23 LAB — URINALYSIS, COMPLETE
Bilirubin, UA: NEGATIVE
Glucose, UA: NEGATIVE
Ketones, UA: NEGATIVE
Nitrite, UA: NEGATIVE
Specific Gravity, UA: 1.01 (ref 1.005–1.030)
Urobilinogen, Ur: 2 mg/dL — ABNORMAL HIGH (ref 0.2–1.0)
pH, UA: 6 (ref 5.0–7.5)

## 2022-02-23 LAB — MICROSCOPIC EXAMINATION
Epithelial Cells (non renal): NONE SEEN /hpf (ref 0–10)
Renal Epithel, UA: NONE SEEN /hpf

## 2022-02-23 MED ORDER — PRAMIPEXOLE DIHYDROCHLORIDE 0.5 MG PO TABS: 0.5000 mg | ORAL_TABLET | Freq: Three times a day (TID) | ORAL | 1 refills | Status: DC

## 2022-02-23 MED ORDER — SULFAMETHOXAZOLE-TRIMETHOPRIM 800-160 MG PO TABS: 1.0000 | ORAL_TABLET | Freq: Two times a day (BID) | ORAL | 0 refills | Status: DC

## 2022-02-23 NOTE — Progress Notes (Signed)
? ?Subjective:  ? ? Patient ID: Randy [name], male    DOB: [birth year], 76 y.o.   MRN: [id number] ? ? ?Chief Complaint: Tremors, Nausea, and Urinary Frequency ? ? ?Urinary Frequency  ?Associated symptoms include frequency and urgency. Pertinent negatives include no chills.  ?Patient comes in was accompanied his wife. He is c/o tremors, chills. nausea and confusion. Also having urinary frequency. The shakiness has caused him to not be able to hardly walk. He has a history of parkinson's and use to be on meds, but he stopped taking. He daughter is a NP and she gave him some zofran that seemed to help. Was on mirapex. ? ? ? ?Review of Systems  ?Constitutional:  Negative for chills and fever.  ?Genitourinary:  Positive for frequency and urgency. Negative for dysuria.  ?Neurological:  Positive for tremors.  ? ?   ?Objective:  ? Physical Exam ?Vitals and nursing note reviewed.  ?Constitutional:   ?   Appearance: Normal appearance. He is well-developed.  ?Neck:  ?   Thyroid: No thyroid mass or thyromegaly.  ?   Vascular: No carotid bruit or JVD.  ?   Trachea: Phonation normal.  ?Cardiovascular:  ?   Rate and Rhythm: Normal rate and regular rhythm.  ?Pulmonary:  ?   Effort: Pulmonary effort is normal. No respiratory distress.  ?   Breath sounds: Normal breath sounds.  ?Abdominal:  ?   General: Bowel sounds are normal.  ?   Palpations: Abdomen is soft.  ?Musculoskeletal:     ?   General: Normal range of motion.  ?   Cervical back: Normal range of motion and neck supple.  ?Lymphadenopathy:  ?   Cervical: No cervical adenopathy.  ?Skin: ?   General: Skin is warm and dry.  ?Neurological:  ?   Mental Status: He is alert and oriented to person, place, and time.  ?   Cranial Nerves: No cranial nerve deficit.  ?   Sensory: No sensory deficit.  ?   Comments: Tremor of bil hands  ?Psychiatric:     ?   Behavior: Behavior normal.     ?   Thought Content: Thought content normal.     ?   Judgment: Judgment normal.  ? ? ?BP 119/74    Pulse 84   Temp 98 ?F (36.7 ?C) (Temporal)   Resp 20   Ht 5\' 9"  (1.753 m)   Wt 180 lb (81.6 kg)   SpO2 97%   BMI 26.58 kg/m?  ? ? ? ?   ?Assessment & Plan:  ? ?Jb R Prabhu in today with chief complaint of Tremors, Nausea, and Urinary Frequency ? ? ?1. Dysuria ?- Urinalysis, Complete ? ?2. Acute cystitis with hematuria ?Take medication as prescribe ?Cotton underwear ?Take shower not bath ?Cranberry juice, yogurt ?Force fluids ?AZO over the counter X2 days ?Culture pending ?RTO prn ? ?- sulfamethoxazole-trimethoprim (BACTRIM DS) 800-160 MG tablet; Take 1 tablet by mouth 2 (two) times daily.  Dispense: 20 tablet; Refill: 0 ? ?3. Parkinson's disease (HCC) ?Need to see neurologist ?- pramipexole (MIRAPEX) 0.5 MG tablet; Take 1 tablet (0.5 mg total) by mouth 3 (three) times daily.  Dispense: 90 tablet; Refill: 1 ? ? ? ?The above assessment and management plan was discussed with the patient. The patient verbalized understanding of and has agreed to the management plan. Patient is aware to call the clinic if symptoms persist or worsen. Patient is aware when to return to the clinic for a follow-up visit.  Patient educated on when it is appropriate to go to the emergency department.  ? ?Mary-Margaret [name], FNP ? ? ?

## 2022-02-23 NOTE — Patient Instructions (Signed)
Parkinson's Disease °Parkinson's disease causes problems with movements. It makes it harder for you to walk or control your body. It is a long-term condition. It gets worse over time. °What are the causes? °This condition is caused by a loss of brain cells called neurons. These brain cells make a chemical called dopamine, which is needed to control body movement. It is not known what causes the brain cells to die. °What increases the risk? °Being male. °Being age 60 or older. °Having family members who had Parkinson's disease. °Having had an injury to the brain. °Being around things that are harmful or poisonous, such as pesticides. °Being very sad (depressed). °What are the signs or symptoms? °Symptoms of this condition can vary. The main symptoms can be seen in your movement. These include: °Shaking or tremors that you cannot control. This happens while you are resting. °Stiffness in your neck, arms, and legs. °Trouble making small movements that are needed to button your clothing or brush your teeth. °Losing facial expressions. °Walking in a way that is not normal. You may walk with short, shuffling steps. °Loss of balance when standing. You may sway, fall backward, or have trouble making turns. °Other symptoms include: °Being very sad or worried. °Having false beliefs (delusions). °Seeing, hearing, or feeling things that are not real. °Trouble speaking or swallowing. °Having a hard time pooping (constipation). °Needing to pee right away, peeing often, or not being able to control when you pee or poop. °Sleep problems. °How is this treated? °There is no cure for this disease. The goal of treatment is to manage your symptoms. Treatment may include: °Medicines. °Therapy to help with talking or movement. °Surgery to reduce shaking and other movements that you cannot control. °Follow these instructions at home: °Medicines °Take over-the-counter and prescription medicines only as told by your doctor. °Avoid taking  pain or sleeping medicines. These medicines affect your thinking. °Eating and drinking °Follow instructions from your doctor about what you cannot eat or drink. °Do not drink alcohol. °Activity °Ask your doctor if it is safe for you to drive. °Do exercises as told by your doctor. °Lifestyle ° °Put in grab bars and railings in your home, especially in the toilet and shower. These help to prevent falls. °Do not smoke or use any products that contain nicotine or tobacco. If you need help quitting, ask your doctor. °Join a support group. °General instructions °Talk with your doctor to know the type of help that you need at home. Ask your doctor about ways to stay safe. °Keep all follow-up visits. These include going to see a physical therapist, speech therapist, or occupational therapist. °Where to find more information °National Institute of Neurological Disorders and Stroke: www.ninds.nih.gov °Parkinson's Foundation: www.parkinson.org °Contact a doctor if: °Medicines do not help your symptoms. °You keep losing your balance. °You fall at home. °You need more help at home. °You have trouble swallowing. °You have a very hard time pooping. °You have a lot of side effects from your medicines. °You feel very sad, worried, or confused. °You see, hear, or feel things that are not real. °Get help right away if: °You were hurt in a fall. °You cannot swallow without choking. °You have chest pain or trouble breathing. °You do not feel safe at home. °You have thoughts about hurting yourself or other people. °These symptoms may be an emergency. Get help right away. Call your local emergency services (911 in the [place] °Do not wait to see if the symptoms will go away. °Do   not drive yourself to the hospital. °Get help right away if you feel like you may hurt yourself or others, or have thoughts about taking your own life. Go to your nearest emergency room or: °Call your local emergency services (911 in the [place] °Call the National  Suicide Prevention Lifeline at 1-800-273-8255 or 988 in the [place] This is open 24 hours a day. °Text the Crisis Text Line at 741741. °Summary °Parkinson's disease causes problems with movements. °It is a long-term condition. It gets worse over time. °There is no cure. Treatment focuses on managing your symptoms. °Talk with your doctor to know the type of help that you need at home. Ask your doctor about ways to stay safe. °Keep all follow-up visits. °This information is not intended to replace advice given to you by your health care provider. Make sure you discuss any questions you have with your health care provider. °Document Revised: 06/08/2021 Document Reviewed: 02/28/2021 °Elsevier Patient Education © 2022 [employer] Inc. ° °

## 2022-02-25 LAB — URINE CULTURE

## 2022-05-01 ENCOUNTER — Telehealth: Payer: Self-pay | Admitting: Family Medicine

## 2022-05-01 DIAGNOSIS — E782 Mixed hyperlipidemia: Secondary | ICD-10-CM

## 2022-05-01 MED ORDER — FENOFIBRATE 145 MG PO TABS: 145.0000 mg | ORAL_TABLET | Freq: Every day | ORAL | 0 refills | Status: DC

## 2022-05-01 NOTE — Telephone Encounter (Signed)
Meds sent, wife aware

## 2022-05-01 NOTE — Telephone Encounter (Signed)
Pts wife called stating that they are still waiting to receive pts Fenofibrate Rx via mail. Says pt has been a week without it and has spoken to the mail order service who keep telling them its on the way but they still have not received it. Wants to know if PCP can send in a Rx to [facility] in [place] to last pt until he gets his shipment.

## 2022-07-25 DIAGNOSIS — H5200 Hypermetropia, unspecified eye: Secondary | ICD-10-CM | POA: Diagnosis not present

## 2022-07-25 DIAGNOSIS — H35363 Drusen (degenerative) of macula, bilateral: Secondary | ICD-10-CM | POA: Diagnosis not present

## 2022-07-25 DIAGNOSIS — Z0100 Encounter for examination of eyes and vision without abnormal findings: Secondary | ICD-10-CM | POA: Diagnosis not present

## 2022-07-25 DIAGNOSIS — H25813 Combined forms of age-related cataract, bilateral: Secondary | ICD-10-CM | POA: Diagnosis not present

## 2022-10-29 ENCOUNTER — Other Ambulatory Visit: Payer: Self-pay | Admitting: Family Medicine

## 2022-11-01 ENCOUNTER — Ambulatory Visit (INDEPENDENT_AMBULATORY_CARE_PROVIDER_SITE_OTHER): Payer: Medicare HMO | Admitting: Family Medicine

## 2022-11-01 ENCOUNTER — Encounter: Payer: Self-pay | Admitting: Family Medicine

## 2022-11-01 VITALS — BP 116/73 | HR 87 | Temp 98.2°F | Ht 69.0 in | Wt 179.4 lb

## 2022-11-01 DIAGNOSIS — G20A1 Parkinson's disease without dyskinesia, without mention of fluctuations: Secondary | ICD-10-CM | POA: Diagnosis not present

## 2022-11-01 DIAGNOSIS — F411 Generalized anxiety disorder: Secondary | ICD-10-CM | POA: Diagnosis not present

## 2022-11-01 MED ORDER — LORAZEPAM 0.5 MG PO TABS: 0.2500 mg | ORAL_TABLET | Freq: Every day | ORAL | 0 refills | Status: DC | PRN

## 2022-11-01 MED ORDER — MIRTAZAPINE 7.5 MG PO TABS: 7.5000 mg | ORAL_TABLET | Freq: Every day | ORAL | 0 refills | Status: DC
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
## 2022-11-01 NOTE — Patient Instructions (Addendum)
We discussed the dangers of benzodiazepine medication today.  Lorazepam (Ativan) can cause hallucinations, falls, breathing problems, dementia and even death.    I have ordered Mirtazapine for you to start at bedtime.  This can cause sleepiness but should help with anxiety and panic attacks.  Controlled Substance Guidelines:  1. You cannot get an early refill, even it is lost.  2. You cannot get controlled medications from any other doctor, unless it is the emergency department and related to a new problem or injury.  3. You cannot use alcohol, marijuana, cocaine or any other recreational drugs while using this medication. This is very dangerous.  4. You are willing to have your urine drug tested at each visit.  5. You will not drive while using this medication, because that can put yourself and others in serious danger of an accident. 6. If any medication is stolen, then there must be a police report to verify it, or it cannot be refilled.  7. I will not prescribe these medications for longer than 3 months.  8. You must bring your pill bottle to each visit.  9. You must use the same pharmacy for all refills for the medication, unless you clear it with me beforehand.  10. You cannot share or sell this medication.

## 2022-11-03 LAB — TOXASSURE SELECT 13 (MW), URINE

## 2022-12-13 ENCOUNTER — Telehealth: Payer: Medicare HMO | Admitting: Family Medicine

## 2023-01-11 ENCOUNTER — Other Ambulatory Visit: Payer: Self-pay | Admitting: Family Medicine

## 2023-01-12 ENCOUNTER — Encounter: Payer: Self-pay | Admitting: *Deleted

## 2023-01-12 DIAGNOSIS — Z006 Encounter for examination for normal comparison and control in clinical research program: Secondary | ICD-10-CM

## 2023-01-12 NOTE — Research (Signed)
Spoke with Mr Losee states its ok to send V1P information. Emailed a copy of the consent to him. Encouraged him to  call with any questions.

## 2023-01-22 ENCOUNTER — Encounter: Payer: Self-pay | Admitting: Family Medicine

## 2023-01-22 ENCOUNTER — Ambulatory Visit (INDEPENDENT_AMBULATORY_CARE_PROVIDER_SITE_OTHER): Payer: Medicare HMO | Admitting: Family Medicine

## 2023-01-22 VITALS — BP 120/76 | HR 69 | Temp 98.6°F | Ht 69.0 in | Wt 181.0 lb

## 2023-01-22 DIAGNOSIS — F411 Generalized anxiety disorder: Secondary | ICD-10-CM | POA: Diagnosis not present

## 2023-01-22 DIAGNOSIS — Z0001 Encounter for general adult medical examination with abnormal findings: Secondary | ICD-10-CM

## 2023-01-22 DIAGNOSIS — B356 Tinea cruris: Secondary | ICD-10-CM

## 2023-01-22 DIAGNOSIS — I10 Essential (primary) hypertension: Secondary | ICD-10-CM | POA: Diagnosis not present

## 2023-01-22 DIAGNOSIS — Z125 Encounter for screening for malignant neoplasm of prostate: Secondary | ICD-10-CM

## 2023-01-22 DIAGNOSIS — E782 Mixed hyperlipidemia: Secondary | ICD-10-CM | POA: Diagnosis not present

## 2023-01-22 DIAGNOSIS — Z0000 Encounter for general adult medical examination without abnormal findings: Secondary | ICD-10-CM

## 2023-01-22 DIAGNOSIS — G20A1 Parkinson's disease without dyskinesia, without mention of fluctuations: Secondary | ICD-10-CM | POA: Diagnosis not present

## 2023-01-22 MED ORDER — KETOCONAZOLE 2 % EX CREA: 1.0000 | TOPICAL_CREAM | Freq: Every day | CUTANEOUS | 0 refills | Status: DC
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
## 2023-01-22 NOTE — Patient Instructions (Signed)
Basic Skin Care Your skin plays an important role in keeping the entire body healthy.  Below are some tips on how to try and maximize skin health from the outside in.  Bathe in mildly warm water every 1 to 3 days, followed by light drying and an application of a thick moisturizer cream or ointment, preferably one that comes in a tub. Fragrance free moisturizing bars or body washes are preferred such as Purpose, Cetaphil, Dove sensitive skin, Aveeno, [employer] or Vanicream products. Use a fragrance free cream or ointment, not a lotion, such as plain petroleum jelly or Vaseline ointment, Aquaphor, Vanicream, Eucerin cream or a generic version, CeraVe Cream, Cetaphil Restoraderm, Aveeno Eczema Therapy and [employer], among others. Children with very dry skin often need to put on these creams two, three or four times a day.  As much as possible, use these creams enough to keep the skin from looking dry. Consider using fragrance free/dye free detergent, such as Arm and Hammer for sensitive skin, Tide Free or All Free.   If I am prescribing a medication to go on the skin, the medicine goes on first to the areas that need it, followed by a thick cream as above to the entire body.  Nancy Fetter is a major cause of damage to the skin. I recommend sun protection for all of my patients. I prefer physical barriers such as hats with wide brims that cover the ears, long sleeve clothing with SPF protection including rash guards for swimming. These can be found seasonally at outdoor clothing companies, [employer] and [employer] and online at [employer] Hann[employer]in.com, www.uvskinz.com and PlayDetails.hu. Avoid peak sun between the hours of 10am to 3pm to minimize sun exposure.  I recommend sunscreen for all of my patients older than 12 months of age when in the sun, preferably with broad spectrum coverage and SPF 30 or higher.  For children, I recommend sunscreens that only contain titanium dioxide and/or zinc  oxide in the active ingredients. These do not burn the eyes and appear to be safer than chemical sunscreens. These sunscreens include zinc oxide paste found in the diaper section, Vanicream Broad Spectrum 50+, Aveeno Natural Mineral Protection, Neutrogena Pure and Free Baby, Johnson and [employer] Daily face and body lotion, Bed Bath & Beyond, among others. There is no such thing as waterproof sunscreen. All sunscreens should be reapplied after 60-80 minutes of wear.  Spray on sunscreens often use chemical sunscreens which do protect against the sun. However, these can be difficult to apply correctly, especially if wind is present, and can be more likely to irritate the skin.  Long term effects of chemical sunscreens are also not fully known.      Jock Itch Jock itch is an itchy rash in the groin and upper thigh area. It is a skin infection that is caused by a type of germ (fungus). The rash usually goes away in 2-3 weeks with treatment. What are the causes? This condition may be caused by: Touching a fungal infection elsewhere on your body, such as athlete's foot, and then touching your groin area. Sharing towels or clothing, such as socks or shoes, with someone who has a fungal infection. What increases the risk? This condition is most common in men and adolescent boys. You are also more likely to develop the condition if you: Are in a hot, humid climate. Wear tight-fitting clothes or wet bathing suits for a long time. Play sports. Are overweight. Have diabetes. Have a weakened body defense system (  immune system). Sweat a lot. What are the signs or symptoms? Symptoms of this condition include: A red, pink, or brown rash in the groin area. There may be blisters. The rash may spread to your thighs, the opening between the butt (anus), and the butt. Dry and scaly skin on or around the rash. Itchiness. How is this treated? Treatment for this condition may include: Medicine to  kill the fungus (antifungal). This may be one of the following: Skin cream. Ointment. Powder. Medicine that you take by mouth. Skin cream or ointment to reduce itching. Lifestyle changes, such as wearing looser clothing and caring for your skin. Follow these instructions at home: Skin care Use skin creams, ointments, or powders exactly as told by your doctor. Wear clothes that are loose. Clothes should not rub against your groin area. Men should wear boxer shorts or loose underwear. Keep your groin area clean and dry. Change your underwear every day. Change out of wet bathing suits as soon as you can. After bathing, use a different towel to dry your groin area. Dry the area gently and completely. Avoid hot baths and showers. Hot water can make itching worse. Do not scratch the area. General instructions Take and apply over-the-counter and prescription medicines only as told by your doctor. Do not share towels or clothing with other people. Wash your hands often with soap and water for at least 20 seconds, especially after touching your groin area. If you cannot use soap and water, use hand sanitizer. When at the gym: Always wear shoes, especially in the shower and around the swimming pool. Keep any cuts covered. Clean any mats or equipment before using them. Shower as soon as you are done working out. Keep all follow-up visits. Contact a doctor if: Your rash: Gets worse. Does not get better after 2 weeks of treatment. Spreads. Comes back after treatment is done. You have a fever. You have new or more redness, swelling, or pain around your rash. You have fluid, blood, or pus coming from your rash. Summary Jock itch is an itchy rash. It affects the groin and upper thigh area. Jock itch usually goes away in 2-3 weeks with treatment. Keep your groin area clean and dry. This information is not intended to replace advice given to you by your health care provider. Make sure you  discuss any questions you have with your health care provider. Document Revised: 02/01/2021 Document Reviewed: 02/01/2021 Elsevier Patient Education  [place].

## 2023-01-23 LAB — CMP14+EGFR
ALT: 15 IU/L (ref 0–44)
AST: 25 IU/L (ref 0–40)
Albumin/Globulin Ratio: 1.7 (ref 1.2–2.2)
Albumin: 4 g/dL (ref 3.8–4.8)
Alkaline Phosphatase: 80 IU/L (ref 44–121)
BUN/Creatinine Ratio: 20 (ref 10–24)
BUN: 25 mg/dL (ref 8–27)
Bilirubin Total: 0.6 mg/dL (ref 0.0–1.2)
CO2: 22 mmol/L (ref 20–29)
Calcium: 9.5 mg/dL (ref 8.6–10.2)
Chloride: 102 mmol/L (ref 96–106)
Creatinine, Ser: 1.28 mg/dL — ABNORMAL HIGH (ref 0.76–1.27)
Globulin, Total: 2.3 g/dL (ref 1.5–4.5)
Glucose: 82 mg/dL (ref 70–99)
Potassium: 4.3 mmol/L (ref 3.5–5.2)
Sodium: 139 mmol/L (ref 134–144)
Total Protein: 6.3 g/dL (ref 6.0–8.5)
eGFR: 58 mL/min/{1.73_m2} — ABNORMAL LOW (ref >59)

## 2023-01-23 LAB — CBC
Hematocrit: 44.1 % (ref 37.5–51.0)
Hemoglobin: 14.4 g/dL (ref 13.0–17.7)
MCH: 30.4 pg (ref 26.6–33.0)
MCHC: 32.7 g/dL (ref 31.5–35.7)
MCV: 93 fL (ref 79–97)
Platelets: 195 10*3/uL (ref 150–450)
RBC: 4.74 x10E6/uL (ref 4.14–5.80)
RDW: 13.1 % (ref 11.6–15.4)
WBC: 4.3 10*3/uL (ref 3.4–10.8)

## 2023-01-23 LAB — PSA: Prostate Specific Ag, Serum: 0.4 ng/mL (ref 0.0–4.0)

## 2023-01-23 LAB — TSH: TSH: 1.64 u[IU]/mL (ref 0.450–4.500)

## 2023-01-23 LAB — LIPID PANEL
Chol/HDL Ratio: 3.5 ratio (ref 0.0–5.0)
Cholesterol, Total: 119 mg/dL (ref 100–199)
HDL: 34 mg/dL — ABNORMAL LOW (ref >39)
LDL Chol Calc (NIH): 64 mg/dL (ref 0–99)
Triglycerides: 112 mg/dL (ref 0–149)
VLDL Cholesterol Cal: 21 mg/dL (ref 5–40)

## 2023-03-26 ENCOUNTER — Other Ambulatory Visit: Payer: Self-pay | Admitting: Family Medicine

## 2023-04-25 ENCOUNTER — Other Ambulatory Visit: Payer: Self-pay | Admitting: Family Medicine

## 2023-04-25 DIAGNOSIS — E782 Mixed hyperlipidemia: Secondary | ICD-10-CM

## 2023-04-25 NOTE — Telephone Encounter (Signed)
Please make sure pt has an appt scheduled.  I will give him enough to last him until his next physical with labs.

## 2023-04-25 NOTE — Telephone Encounter (Signed)
PE 01/22/23, Please make yearly PE in Feb 12/2023

## 2023-04-25 NOTE — Telephone Encounter (Signed)
LMTCB to schedule CPE °

## 2023-06-07 ENCOUNTER — Other Ambulatory Visit: Payer: Self-pay | Admitting: Family Medicine

## 2023-08-28 ENCOUNTER — Ambulatory Visit (INDEPENDENT_AMBULATORY_CARE_PROVIDER_SITE_OTHER): Payer: Medicare HMO | Admitting: Family Medicine

## 2023-08-28 ENCOUNTER — Encounter: Payer: Self-pay | Admitting: Family Medicine

## 2023-08-28 VITALS — BP 119/71 | HR 69 | Temp 98.9°F | Ht 69.0 in | Wt 178.0 lb

## 2023-08-28 DIAGNOSIS — R5383 Other fatigue: Secondary | ICD-10-CM | POA: Diagnosis not present

## 2023-08-28 DIAGNOSIS — I10 Essential (primary) hypertension: Secondary | ICD-10-CM

## 2023-08-28 DIAGNOSIS — E782 Mixed hyperlipidemia: Secondary | ICD-10-CM

## 2023-08-28 DIAGNOSIS — B356 Tinea cruris: Secondary | ICD-10-CM | POA: Diagnosis not present

## 2023-08-28 DIAGNOSIS — G20B2 Parkinson's disease with dyskinesia, with fluctuations: Secondary | ICD-10-CM | POA: Diagnosis not present

## 2023-08-28 DIAGNOSIS — G479 Sleep disorder, unspecified: Secondary | ICD-10-CM

## 2023-08-28 DIAGNOSIS — R931 Abnormal findings on diagnostic imaging of heart and coronary circulation: Secondary | ICD-10-CM | POA: Diagnosis not present

## 2023-08-28 DIAGNOSIS — R6889 Other general symptoms and signs: Secondary | ICD-10-CM | POA: Diagnosis not present

## 2023-08-28 LAB — URINALYSIS
Bilirubin, UA: NEGATIVE
Glucose, UA: NEGATIVE
Ketones, UA: NEGATIVE
Nitrite, UA: NEGATIVE
Protein,UA: NEGATIVE
RBC, UA: NEGATIVE
Specific Gravity, UA: 1.02 (ref 1.005–1.030)
Urobilinogen, Ur: 1 mg/dL (ref 0.2–1.0)
pH, UA: 7 (ref 5.0–7.5)

## 2023-08-28 NOTE — Patient Instructions (Signed)
Take the Mirtazapine for sleep/ anxiety
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
## 2023-08-29 LAB — CBC
Hematocrit: 47.7 % (ref 37.5–51.0)
Hemoglobin: 15.8 g/dL (ref 13.0–17.7)
MCH: 32 pg (ref 26.6–33.0)
MCHC: 33.1 g/dL (ref 31.5–35.7)
MCV: 97 fL (ref 79–97)
Platelets: 250 10*3/uL (ref 150–450)
RBC: 4.93 x10E6/uL (ref 4.14–5.80)
RDW: 13 % (ref 11.6–15.4)
WBC: 6.7 10*3/uL (ref 3.4–10.8)

## 2023-08-29 LAB — CMP14+EGFR
ALT: 18 [IU]/L (ref 0–44)
AST: 28 [IU]/L (ref 0–40)
Albumin: 4.4 g/dL (ref 3.8–4.8)
Alkaline Phosphatase: 74 [IU]/L (ref 44–121)
BUN/Creatinine Ratio: 15 (ref 10–24)
BUN: 18 mg/dL (ref 8–27)
Bilirubin Total: 0.7 mg/dL (ref 0.0–1.2)
CO2: 23 mmol/L (ref 20–29)
Calcium: 10.5 mg/dL — ABNORMAL HIGH (ref 8.6–10.2)
Chloride: 100 mmol/L (ref 96–106)
Creatinine, Ser: 1.24 mg/dL (ref 0.76–1.27)
Globulin, Total: 2.8 g/dL (ref 1.5–4.5)
Glucose: 74 mg/dL (ref 70–99)
Potassium: 4.6 mmol/L (ref 3.5–5.2)
Sodium: 139 mmol/L (ref 134–144)
Total Protein: 7.2 g/dL (ref 6.0–8.5)
eGFR: 60 mL/min/{1.73_m2} (ref >59)

## 2023-08-29 LAB — IRON,TIBC AND FERRITIN PANEL
Ferritin: 914 ng/mL — ABNORMAL HIGH (ref 30–400)
Iron Saturation: 39 % (ref 15–55)
Iron: 124 ug/dL (ref 38–169)
Total Iron Binding Capacity: 320 ug/dL (ref 250–450)
UIBC: 196 ug/dL (ref 111–343)

## 2023-08-29 LAB — VITAMIN B12: Vitamin B-12: 253 pg/mL (ref 232–1245)

## 2023-08-29 LAB — T4, FREE: Free T4: 1.51 ng/dL (ref 0.82–1.77)

## 2023-08-29 LAB — TSH: TSH: 2.47 u[IU]/mL (ref 0.450–4.500)

## 2023-09-17 DIAGNOSIS — H524 Presbyopia: Secondary | ICD-10-CM | POA: Diagnosis not present

## 2023-09-17 DIAGNOSIS — H25813 Combined forms of age-related cataract, bilateral: Secondary | ICD-10-CM | POA: Diagnosis not present

## 2023-09-17 DIAGNOSIS — H52223 Regular astigmatism, bilateral: Secondary | ICD-10-CM | POA: Diagnosis not present

## 2023-09-17 DIAGNOSIS — H53002 Unspecified amblyopia, left eye: Secondary | ICD-10-CM | POA: Diagnosis not present

## 2023-09-17 DIAGNOSIS — H5203 Hypermetropia, bilateral: Secondary | ICD-10-CM | POA: Diagnosis not present

## 2023-09-17 DIAGNOSIS — H35363 Drusen (degenerative) of macula, bilateral: Secondary | ICD-10-CM | POA: Diagnosis not present

## 2023-09-19 ENCOUNTER — Other Ambulatory Visit: Payer: Self-pay | Admitting: Family Medicine

## 2023-09-19 DIAGNOSIS — E782 Mixed hyperlipidemia: Secondary | ICD-10-CM

## 2023-10-31 ENCOUNTER — Other Ambulatory Visit: Payer: Self-pay | Admitting: Family Medicine

## 2023-11-07 ENCOUNTER — Other Ambulatory Visit: Payer: Self-pay

## 2023-11-07 DIAGNOSIS — F411 Generalized anxiety disorder: Secondary | ICD-10-CM

## 2023-11-07 DIAGNOSIS — G20A1 Parkinson's disease without dyskinesia, without mention of fluctuations: Secondary | ICD-10-CM

## 2023-11-07 MED ORDER — MIRTAZAPINE 7.5 MG PO TABS
7.5000 mg | ORAL_TABLET | Freq: Every day | ORAL | 3 refills | Status: DC
Start: 2023-11-07 — End: 2024-01-25

## 2023-12-05 ENCOUNTER — Ambulatory Visit: Payer: Medicare HMO | Admitting: Cardiovascular Disease

## 2024-01-08 ENCOUNTER — Ambulatory Visit: Payer: Medicare HMO | Attending: Cardiovascular Disease | Admitting: Cardiovascular Disease

## 2024-01-08 ENCOUNTER — Encounter: Payer: Self-pay | Admitting: Cardiovascular Disease

## 2024-01-08 VITALS — BP 128/72 | HR 70 | Ht 70.0 in | Wt 182.2 lb

## 2024-01-08 DIAGNOSIS — R931 Abnormal findings on diagnostic imaging of heart and coronary circulation: Secondary | ICD-10-CM | POA: Diagnosis not present

## 2024-01-08 DIAGNOSIS — I10 Essential (primary) hypertension: Secondary | ICD-10-CM

## 2024-01-08 DIAGNOSIS — E782 Mixed hyperlipidemia: Secondary | ICD-10-CM | POA: Diagnosis not present

## 2024-01-08 NOTE — Patient Instructions (Signed)
Medication Instructions:  Your physician recommends that you continue on your current medications as directed. Please refer to the Current Medication list given to you today.  *If you need a refill on your cardiac medications before your next appointment, please call your pharmacy*   Follow-Up: At [facility], you and your health needs are our priority.  As part of our continuing mission to provide you with exceptional heart care, we have created designated Provider Care Teams.  These Care Teams include your primary Cardiologist (physician) and Advanced Practice Providers (APPs -  Physician Assistants and Nurse Practitioners) who all work together to provide you with the care you need, when you need it.  We recommend signing up for the patient portal called "MyChart".  Sign up information is provided on this After Visit Summary.  MyChart is used to connect with patients for Virtual Visits (Telemedicine).  Patients are able to view lab/test results, encounter notes, upcoming appointments, etc.  Non-urgent messages can be sent to your provider as well.   To learn more about what you can do with MyChart, go to [email or web address].    Your next appointment:   12 month(s)  Provider:   Marjie Skiff, PA-C, Robet Leu, PA-C, Azalee Course, PA-C, Bernadene Person, NP, or Reather Littler, NP        Then, Nanetta Batty, MD  will plan to see you again in 2 year(s).    Other Instructions

## 2024-01-08 NOTE — Assessment & Plan Note (Signed)
History of essential hypertension with blood pressure measured today at 128/72.  He is on Toprol.

## 2024-01-08 NOTE — Assessment & Plan Note (Signed)
Coronary calcium score performed 12/20/2021 was 198 with calcium and all 3 coronary arteries.  He denies chest pain.  He is at goal for secondary prevention.

## 2024-01-08 NOTE — Progress Notes (Signed)
01/08/2024 Randy [name]   1946-01-21  [id number]  Primary Physician Raliegh Ip, DO Primary Cardiologist: Runell Gess MD FACP, [place], [place], [place]  HPI:  Randy [name] is a 78 y.o.   moderately overweight, married Caucasian male, father of 2 without I last saw in the office 11/11/2021.[name]  He is accompanied by his wife Randy [name] today.  He has risk factors that include hypertension, hyperlipidemia . He had a Myoview stress test that was normal except for an abnormal electrocardiographic response to exercise and underwent MET-testing that was normal as well.    Since I saw him in the office 2 years ago his Parkinson's disease has remained stable.  He denies chest pain or shortness of breath.  He did have a coronary calcium score performed 12/16/2021 which was 198 distributed through all 3 coronary arteries.   Current Meds  Medication Sig   calcium-vitamin D (OSCAL WITH D) 500-200 MG-UNIT tablet Take 1 tablet by mouth.   fenofibrate (TRICOR) 145 MG tablet TAKE 1 TABLET EVERY DAY   ibuprofen (ADVIL) 600 MG tablet Take 1 tablet (600 mg total) by mouth every 8 (eight) hours as needed.   ketoconazole (NIZORAL) 2 % cream Apply 1 Application topically daily. X7-14 days until jock itch resolved   metoprolol succinate (TOPROL-XL) 50 MG 24 hr tablet TAKE 1 TABLET DAILY. TAKE WITH OR IMMEDIATELY FOLLOWING A MEAL. (TO REPLACE METOPROLOL TARTRATE)   mirtazapine (REMERON) 7.5 MG tablet Take 1 tablet (7.5 mg total) by mouth at bedtime. For anxiety/ sleep   omeprazole (PRILOSEC) 20 MG capsule Take 1 capsule (20 mg total) by mouth daily.     No Known Allergies  Social History   Socioeconomic History   Marital status: Married    Spouse name: Hazel   Number of children: 2   Years of education: 12   Highest education level: High school graduate  Occupational History   Occupation: Retired  Tobacco Use   Smoking status: Never   Smokeless tobacco: Never  Vaping Use   Vaping status:  Never Used  Substance and Sexual Activity   Alcohol use: Not Currently   Drug use: No   Sexual activity: Not Currently    Birth control/protection: None  Other Topics Concern   Not on file  Social History Narrative   Lives with wife   Caffeine use:  Diet coke/pepso   Right handed    Social Drivers of Health   Financial Resource Strain: Low Risk  (04/28/2019)   Overall Financial Resource Strain (CARDIA)    Difficulty of Paying Living Expenses: Not hard at all  Food Insecurity: No Food Insecurity (04/28/2019)   Hunger Vital Sign    Worried About Running Out of Food in the Last Year: Never true    Ran Out of Food in the Last Year: Never true  Transportation Needs: No Transportation Needs (04/28/2019)   PRAPARE - Administrator, Civil Service (Medical): No    Lack of Transportation (Non-Medical): No  Physical Activity: Inactive (04/28/2019)   Exercise Vital Sign    Days of Exercise per Week: 0 days    Minutes of Exercise per Session: 0 min  Stress: No Stress Concern Present (04/28/2019)   [employer] of Occupational Health - Occupational Stress Questionnaire    Feeling of Stress : Not at all  Social Connections: Somewhat Isolated (04/28/2019)   Social Connection and Isolation Panel [NHANES]    Frequency of Communication with Friends and Family: Three  times a week    Frequency of Social Gatherings with Friends and Family: Three times a week    Attends Religious Services: Never    Active Member of Clubs or Organizations: No    Attends Banker Meetings: Never    Marital Status: Married  Catering manager Violence: Not At Risk (04/28/2019)   Humiliation, Afraid, Rape, and Kick questionnaire    Fear of Current or Ex-Partner: No    Emotionally Abused: No    Physically Abused: No    Sexually Abused: No     Review of Systems: General: negative for chills, fever, night sweats or weight changes.  Cardiovascular: negative for chest pain, dyspnea on exertion, edema,  orthopnea, palpitations, paroxysmal nocturnal dyspnea or shortness of breath Dermatological: negative for rash Respiratory: negative for cough or wheezing Urologic: negative for hematuria Abdominal: negative for nausea, vomiting, diarrhea, bright red blood per rectum, melena, or hematemesis Neurologic: negative for visual changes, syncope, or dizziness All other systems reviewed and are otherwise negative except as noted above.    Blood pressure 128/72, pulse 70, height 5\' 10"  (1.778 m), weight 182 lb 3.2 oz (82.6 kg), SpO2 98%.  General appearance: alert and no distress Neck: no adenopathy, no carotid bruit, no JVD, supple, symmetrical, trachea midline, and thyroid not enlarged, symmetric, no tenderness/mass/nodules Lungs: clear to auscultation bilaterally Heart: regular rate and rhythm, S1, S2 normal, no murmur, click, rub or gallop Extremities: extremities normal, atraumatic, no cyanosis or edema Pulses: 2+ and symmetric Skin: Skin color, texture, turgor normal. No rashes or lesions Neurologic: Grossly normal  EKG EKG Interpretation Date/Time:  Tuesday January 08 2024 14:50:55 EST Ventricular Rate:  70 PR Interval:  170 QRS Duration:  78 QT Interval:  356 QTC Calculation: 384 R Axis:   41  Text Interpretation: Normal sinus rhythm Normal ECG When compared with ECG of 21-Jan-2017 17:15, Vent. rate has decreased BY  35 BPM Nonspecific T wave abnormality no longer evident in Lateral leads Confirmed by Nanetta Batty [phone]) on 01/08/2024 3:01:08 PM    ASSESSMENT AND PLAN:   Essential hypertension History of essential hypertension with blood pressure measured today at 128/72.  He is on Toprol.  Hyperlipidemia History of hyperlipidemia on fenofibrate with lipid profile performed 01/22/2023 revealing a total cholesterol 119, LDL 64 and HDL 34.  Elevated coronary artery calcium score Coronary calcium score performed 12/20/2021 was 198 with calcium and all 3 coronary arteries.  He  denies chest pain.  He is at goal for secondary prevention.     Runell Gess MD FACP,FACC,FAHA, [facility] 01/08/2024 3:09 PM

## 2024-01-08 NOTE — Assessment & Plan Note (Signed)
History of hyperlipidemia on fenofibrate with lipid profile performed 01/22/2023 revealing a total cholesterol 119, LDL 64 and HDL 34.

## 2024-01-12 ENCOUNTER — Other Ambulatory Visit: Payer: Self-pay | Admitting: Family Medicine

## 2024-01-25 ENCOUNTER — Encounter: Payer: Self-pay | Admitting: Family Medicine

## 2024-01-25 ENCOUNTER — Ambulatory Visit: Payer: Medicare HMO | Admitting: Family Medicine

## 2024-01-25 VITALS — BP 121/64 | HR 86 | Temp 98.7°F | Ht 70.0 in | Wt 183.0 lb

## 2024-01-25 DIAGNOSIS — H6123 Impacted cerumen, bilateral: Secondary | ICD-10-CM

## 2024-01-25 DIAGNOSIS — L409 Psoriasis, unspecified: Secondary | ICD-10-CM | POA: Diagnosis not present

## 2024-01-25 DIAGNOSIS — G20B2 Parkinson's disease with dyskinesia, with fluctuations: Secondary | ICD-10-CM | POA: Diagnosis not present

## 2024-01-25 DIAGNOSIS — I10 Essential (primary) hypertension: Secondary | ICD-10-CM | POA: Diagnosis not present

## 2024-01-25 DIAGNOSIS — R351 Nocturia: Secondary | ICD-10-CM | POA: Diagnosis not present

## 2024-01-25 DIAGNOSIS — R931 Abnormal findings on diagnostic imaging of heart and coronary circulation: Secondary | ICD-10-CM

## 2024-01-25 DIAGNOSIS — Z0000 Encounter for general adult medical examination without abnormal findings: Secondary | ICD-10-CM

## 2024-01-25 DIAGNOSIS — E782 Mixed hyperlipidemia: Secondary | ICD-10-CM | POA: Diagnosis not present

## 2024-01-25 DIAGNOSIS — Z0001 Encounter for general adult medical examination with abnormal findings: Secondary | ICD-10-CM

## 2024-01-25 DIAGNOSIS — Z1159 Encounter for screening for other viral diseases: Secondary | ICD-10-CM

## 2024-01-25 DIAGNOSIS — Z2821 Immunization not carried out because of patient refusal: Secondary | ICD-10-CM

## 2024-01-25 DIAGNOSIS — F411 Generalized anxiety disorder: Secondary | ICD-10-CM | POA: Diagnosis not present

## 2024-01-25 LAB — LIPID PANEL

## 2024-01-25 MED ORDER — FENOFIBRATE 145 MG PO TABS: 145.0000 mg | ORAL_TABLET | Freq: Every day | ORAL | 3 refills | Status: DC

## 2024-01-25 MED ORDER — MIRTAZAPINE 7.5 MG PO TABS: 15.0000 mg | ORAL_TABLET | Freq: Every day | ORAL | Status: DC
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
## 2024-01-25 NOTE — Patient Instructions (Signed)
 Message or call me about how the double dose of Mirtazapine goes in about 3 weeks. Also let me know if he wants to see a skin doctor to talk about medication for psoriasis.   Basic Skin Care Your skin plays an important role in keeping the entire body healthy.  Below are some tips on how to try and maximize skin health from the outside in.  Bathe in mildly warm water every 1 to 3 days, followed by light drying and an application of a thick moisturizer cream or ointment, preferably one that comes in a tub. Fragrance free moisturizing bars or body washes are preferred such as Purpose, Cetaphil, Dove sensitive skin, Aveeno, [employer] or Vanicream products. Use a fragrance free cream or ointment, not a lotion, such as plain petroleum jelly or Vaseline ointment, Aquaphor, Vanicream, Eucerin cream or a generic version, CeraVe Cream, Cetaphil Restoraderm, Aveeno Eczema Therapy and [employer], among others. Children with very dry skin often need to put on these creams two, three or four times a day.  As much as possible, use these creams enough to keep the skin from looking dry. Consider using fragrance free/dye free detergent, such as Arm and Hammer for sensitive skin, Tide Free or All Free.   If I am prescribing a medication to go on the skin, the medicine goes on first to the areas that need it, followed by a thick cream as above to the entire body.  Wynelle Link is a major cause of damage to the skin. I recommend sun protection for all of my patients. I prefer physical barriers such as hats with wide brims that cover the ears, long sleeve clothing with SPF protection including rash guards for swimming. These can be found seasonally at outdoor clothing companies, [employer] and [employer] and online at [employer].com, www.uvskinz.com and BrideEmporium.nl. Avoid peak sun between the hours of 10am to 3pm to minimize sun exposure.  I recommend sunscreen for all of my patients older than 68  months of age when in the sun, preferably with broad spectrum coverage and SPF 30 or higher.  For children, I recommend sunscreens that only contain titanium dioxide and/or zinc oxide in the active ingredients. These do not burn the eyes and appear to be safer than chemical sunscreens. These sunscreens include zinc oxide paste found in the diaper section, Vanicream Broad Spectrum 50+, Aveeno Natural Mineral Protection, Neutrogena Pure and Free Baby, [employer] Daily face and body lotion, [employer], among others. There is no such thing as waterproof sunscreen. All sunscreens should be reapplied after 60-80 minutes of wear.  Spray on sunscreens often use chemical sunscreens which do protect against the sun. However, these can be difficult to apply correctly, especially if wind is present, and can be more likely to irritate the skin.  Long term effects of chemical sunscreens are also not fully known.

## 2024-01-26 LAB — CMP14+EGFR
ALT: 13 IU/L (ref 0–44)
AST: 26 IU/L (ref 0–40)
Albumin: 4.2 g/dL (ref 3.8–4.8)
Alkaline Phosphatase: 78 IU/L (ref 44–121)
BUN/Creatinine Ratio: 15 (ref 10–24)
BUN: 20 mg/dL (ref 8–27)
Bilirubin Total: 0.7 mg/dL (ref 0.0–1.2)
CO2: 23 mmol/L (ref 20–29)
Calcium: 9.9 mg/dL (ref 8.6–10.2)
Chloride: 103 mmol/L (ref 96–106)
Creatinine, Ser: 1.31 mg/dL — ABNORMAL HIGH (ref 0.76–1.27)
Globulin, Total: 2.6 g/dL (ref 1.5–4.5)
Glucose: 85 mg/dL (ref 70–99)
Potassium: 4.8 mmol/L (ref 3.5–5.2)
Sodium: 140 mmol/L (ref 134–144)
Total Protein: 6.8 g/dL (ref 6.0–8.5)
eGFR: 56 mL/min/{1.73_m2} — ABNORMAL LOW (ref >59)

## 2024-01-26 LAB — LIPID PANEL
Chol/HDL Ratio: 3.7 ratio (ref 0.0–5.0)
Cholesterol, Total: 126 mg/dL (ref 100–199)
HDL: 34 mg/dL — ABNORMAL LOW (ref >39)
LDL Chol Calc (NIH): 74 mg/dL (ref 0–99)
Triglycerides: 93 mg/dL (ref 0–149)
VLDL Cholesterol Cal: 18 mg/dL (ref 5–40)

## 2024-01-26 LAB — PSA: Prostate Specific Ag, Serum: 0.6 ng/mL (ref 0.0–4.0)

## 2024-01-26 LAB — HEPATITIS C ANTIBODY: Hep C Virus Ab: NONREACTIVE

## 2024-01-28 ENCOUNTER — Encounter: Payer: Self-pay | Admitting: Family Medicine

## 2024-03-26 ENCOUNTER — Ambulatory Visit (INDEPENDENT_AMBULATORY_CARE_PROVIDER_SITE_OTHER)

## 2024-03-26 ENCOUNTER — Encounter: Payer: Self-pay | Admitting: Family Medicine

## 2024-03-26 ENCOUNTER — Ambulatory Visit (INDEPENDENT_AMBULATORY_CARE_PROVIDER_SITE_OTHER): Admitting: Family Medicine

## 2024-03-26 VITALS — BP 114/68 | HR 92 | Temp 97.5°F | Ht 70.0 in | Wt 179.2 lb

## 2024-03-26 DIAGNOSIS — R051 Acute cough: Secondary | ICD-10-CM | POA: Diagnosis not present

## 2024-03-26 DIAGNOSIS — J189 Pneumonia, unspecified organism: Secondary | ICD-10-CM

## 2024-03-26 DIAGNOSIS — R059 Cough, unspecified: Secondary | ICD-10-CM | POA: Diagnosis not present

## 2024-03-26 MED ORDER — AZITHROMYCIN 250 MG PO TABS: ORAL_TABLET | ORAL | 0 refills | Status: DC

## 2024-03-26 MED ORDER — BENZONATATE 100 MG PO CAPS: 100.0000 mg | ORAL_CAPSULE | Freq: Three times a day (TID) | ORAL | 0 refills | Status: DC | PRN

## 2024-03-26 MED ORDER — AMOXICILLIN-POT CLAVULANATE 875-125 MG PO TABS: 1.0000 | ORAL_TABLET | Freq: Two times a day (BID) | ORAL | 0 refills | Status: AC

## 2024-03-26 MED ORDER — GUAIFENESIN ER 600 MG PO TB12: 600.0000 mg | ORAL_TABLET | Freq: Two times a day (BID) | ORAL | 0 refills | Status: AC
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
## 2024-04-08 ENCOUNTER — Ambulatory Visit: Payer: Self-pay | Admitting: *Deleted

## 2024-08-05 ENCOUNTER — Ambulatory Visit (INDEPENDENT_AMBULATORY_CARE_PROVIDER_SITE_OTHER): Admitting: Family Medicine

## 2024-08-05 ENCOUNTER — Encounter: Payer: Self-pay | Admitting: Family Medicine

## 2024-08-05 VITALS — BP 118/64 | HR 72 | Temp 97.9°F | Ht 70.0 in | Wt 169.4 lb

## 2024-08-05 DIAGNOSIS — K5909 Other constipation: Secondary | ICD-10-CM

## 2024-08-05 DIAGNOSIS — N3000 Acute cystitis without hematuria: Secondary | ICD-10-CM | POA: Diagnosis not present

## 2024-08-05 DIAGNOSIS — R350 Frequency of micturition: Secondary | ICD-10-CM | POA: Diagnosis not present

## 2024-08-05 LAB — URINALYSIS, ROUTINE W REFLEX MICROSCOPIC
Bilirubin, UA: NEGATIVE
Glucose, UA: NEGATIVE
Ketones, UA: NEGATIVE
Nitrite, UA: POSITIVE — AB
Protein,UA: NEGATIVE
RBC, UA: NEGATIVE
Specific Gravity, UA: 1.01 (ref 1.005–1.030)
Urobilinogen, Ur: 0.2 mg/dL (ref 0.2–1.0)
pH, UA: 7 (ref 5.0–7.5)

## 2024-08-05 LAB — MICROSCOPIC EXAMINATION
Epithelial Cells (non renal): NONE SEEN /HPF (ref 0–10)
Renal Epithel, UA: NONE SEEN /HPF
Yeast, UA: NONE SEEN

## 2024-08-05 MED ORDER — CEFTRIAXONE SODIUM 1 G IJ SOLR
1.0000 g | Freq: Once | INTRAMUSCULAR | Status: AC
  Administered 2024-08-05: 1 g via INTRAMUSCULAR

## 2024-08-05 MED ORDER — SULFAMETHOXAZOLE-TRIMETHOPRIM 800-160 MG PO TABS: 1.0000 | ORAL_TABLET | Freq: Two times a day (BID) | ORAL | 0 refills | Status: AC

## 2024-08-05 NOTE — Patient Instructions (Signed)
 Ok to use Miralax / Metamucil daily to help with bowel movements.  Urinary Tract Infection, Male A urinary tract infection (UTI) is an infection in your urinary tract. The urinary tract is made up of the organs that make, store, and get rid of pee (urine) in your body. These organs include: The kidneys. The ureters. The bladder. The urethra. What are the causes? Most UTIs are caused by germs called bacteria. They may be in or near your genitals. These germs grow and cause swelling in your urinary tract. What increases the risk? You're more likely to get a UTI if: You have a soft tube called a catheter that drains your pee. You can't control when you pee or poop. You have trouble peeing because of: A prostate that's bigger than normal. A kidney stone. A urinary blockage. A nerve condition that affects your bladder. Not getting enough to drink. You're sexually active. You're an older adult. You're also more likely to get a UTI if you have other health problems, such as: Diabetes. A weak immune system. Your immune system is your body's defense system. Sickle cell disease. Injury of the spine. What are the signs or symptoms? Symptoms may include: Needing to pee right away. Peeing small amounts often. Trouble getting the stream started. Pain or burning when you pee. Blood in your pee. Pee that smells bad or odd. Pain in your belly or lower back. You may also: Feel confused. This may be the first symptom in older adults. Vomit. Not feel hungry. Feel tired or easily annoyed. Have a fever or chills. How is this diagnosed? A UTI is diagnosed based on your medical history and an exam. You may also have other tests. These may include: Pee tests. Blood tests. Tests for sexually transmitted infections (STIs). If you've had more than one UTI, you may need to have imaging studies done to find out why you keep getting them. How is this treated? A UTI can be treated by: Taking  antibiotics or other medicines. Drinking enough fluid to keep your pee pale yellow. In rare cases, a UTI can cause a very bad condition called sepsis. Sepsis may be treated in the hospital. Follow these instructions at home: Medicines Take your medicines only as told by your health care provider. If you were given antibiotics, take them as told by your provider. Do not stop taking them even if you start to feel better. General instructions Make sure you: Pee often and fully. Do not hold your pee for a long time. Pee after you have sex. Contact a health care provider if: Your symptoms don't get better after 1-2 days of taking antibiotics. Your symptoms go away and then come back. You have a fever or chills. You vomit or feel like you may vomit. Get help right away if: You have very bad pain in your back or lower belly. You faint. This information is not intended to replace advice given to you by your health care provider. Make sure you discuss any questions you have with your health care provider. Document Revised: 10/24/2023 Document Reviewed: 02/16/2023 Elsevier Patient Education  2025 [employer].
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
## 2024-08-06 ENCOUNTER — Ambulatory Visit: Payer: Self-pay | Admitting: Family Medicine

## 2024-08-06 ENCOUNTER — Encounter: Payer: Self-pay | Admitting: Family Medicine

## 2024-08-06 LAB — BASIC METABOLIC PANEL WITH GFR
BUN/Creatinine Ratio: 15 (ref 10–24)
BUN: 20 mg/dL (ref 8–27)
CO2: 25 mmol/L (ref 20–29)
Calcium: 10.1 mg/dL (ref 8.6–10.2)
Chloride: 102 mmol/L (ref 96–106)
Creatinine, Ser: 1.35 mg/dL — AB (ref 0.76–1.27)
Glucose: 93 mg/dL (ref 70–99)
Potassium: 4.2 mmol/L (ref 3.5–5.2)
Sodium: 139 mmol/L (ref 134–144)
eGFR: 54 mL/min/1.73 — AB (ref >59)

## 2024-08-06 LAB — CBC WITH DIFFERENTIAL/PLATELET
Basophils Absolute: 0 x10E3/uL (ref 0.0–0.2)
Basos: 1 %
EOS (ABSOLUTE): 0.1 x10E3/uL (ref 0.0–0.4)
Eos: 3 %
Hematocrit: 43.9 % (ref 37.5–51.0)
Hemoglobin: 14.6 g/dL (ref 13.0–17.7)
Immature Grans (Abs): 0 x10E3/uL (ref 0.0–0.1)
Immature Granulocytes: 0 %
Lymphocytes Absolute: 1.7 x10E3/uL (ref 0.7–3.1)
Lymphs: 33 %
MCH: 31.9 pg (ref 26.6–33.0)
MCHC: 33.3 g/dL (ref 31.5–35.7)
MCV: 96 fL (ref 79–97)
Monocytes Absolute: 0.4 x10E3/uL (ref 0.1–0.9)
Monocytes: 7 %
Neutrophils Absolute: 2.9 x10E3/uL (ref 1.4–7.0)
Neutrophils: 56 %
Platelets: 221 x10E3/uL (ref 150–450)
RBC: 4.58 x10E6/uL (ref 4.14–5.80)
RDW: 13.4 % (ref 11.6–15.4)
WBC: 5.1 x10E3/uL (ref 3.4–10.8)

## 2024-08-06 LAB — PSA: Prostate Specific Ag, Serum: 1.8 ng/mL (ref 0.0–4.0)

## 2024-08-08 LAB — URINE CULTURE

## 2024-08-28 ENCOUNTER — Telehealth: Payer: Self-pay

## 2024-08-28 NOTE — Telephone Encounter (Signed)
 Patient's daughter called back to follow up on previous message. Please call back daughter on: [phone]

## 2024-08-28 NOTE — Telephone Encounter (Signed)
 Copied from CRM #[id number]. Topic: Clinical - Order For Equipment >> Aug 28, 2024 11:23 AM Delon DASEN wrote: Reason for CRM: Patient needs lift chair, need to know what forms need to be filled out- please call daughter Carlyon- [phone]

## 2024-08-29 NOTE — Telephone Encounter (Addendum)
 Informed patient's daughter that patient will need a visit for F2F documentation for a lift chair. She is upset and states that patient was seen on 08/05/2024 and that his condition should have been documented appropriately enough at that visit to be good enough for the lift chair. I advised her that that appointment was for a UTI. She informed me that she works in compliance and a physical assessment should have been done regardless of the reason for the visit. She wants us  to pass this information on to his provider and wants a call back.

## 2024-09-01 ENCOUNTER — Ambulatory Visit (INDEPENDENT_AMBULATORY_CARE_PROVIDER_SITE_OTHER): Admitting: Family Medicine

## 2024-09-01 ENCOUNTER — Ambulatory Visit: Payer: Self-pay | Admitting: Family Medicine

## 2024-09-01 ENCOUNTER — Encounter: Payer: Self-pay | Admitting: Family Medicine

## 2024-09-01 VITALS — BP 118/70 | HR 77 | Temp 98.1°F | Ht 70.0 in | Wt 168.4 lb

## 2024-09-01 DIAGNOSIS — F411 Generalized anxiety disorder: Secondary | ICD-10-CM

## 2024-09-01 DIAGNOSIS — R296 Repeated falls: Secondary | ICD-10-CM

## 2024-09-01 DIAGNOSIS — R600 Localized edema: Secondary | ICD-10-CM

## 2024-09-01 DIAGNOSIS — Z87440 Personal history of urinary (tract) infections: Secondary | ICD-10-CM

## 2024-09-01 DIAGNOSIS — G20B2 Parkinson's disease with dyskinesia, with fluctuations: Secondary | ICD-10-CM | POA: Diagnosis not present

## 2024-09-01 DIAGNOSIS — K921 Melena: Secondary | ICD-10-CM

## 2024-09-01 DIAGNOSIS — R351 Nocturia: Secondary | ICD-10-CM | POA: Diagnosis not present

## 2024-09-01 LAB — URINALYSIS, ROUTINE W REFLEX MICROSCOPIC
Bilirubin, UA: NEGATIVE
Glucose, UA: NEGATIVE
Ketones, UA: NEGATIVE
Leukocytes,UA: NEGATIVE
Nitrite, UA: NEGATIVE
Protein,UA: NEGATIVE
RBC, UA: NEGATIVE
Specific Gravity, UA: 1.01 (ref 1.005–1.030)
Urobilinogen, Ur: 1 mg/dL (ref 0.2–1.0)
pH, UA: 7 (ref 5.0–7.5)

## 2024-09-01 LAB — CBC WITH DIFFERENTIAL/PLATELET

## 2024-09-01 MED ORDER — TAMSULOSIN HCL 0.4 MG PO CAPS: 0.4000 mg | ORAL_CAPSULE | Freq: Every day | ORAL | 3 refills | Status: AC
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
## 2024-09-01 NOTE — Telephone Encounter (Signed)
 Attempted to reach the patient's daughter.  LVM.  As she is likely well aware given her background in compliance, he was give a directed physical exam for the acute issue he presented with, which did NOT involve a comprehensive exam, as that was done with his annual physical was in February.  That being said, I am glad to attempt to place the requested order.  Though, I am unsure if his insurance will cover the chair without a directed exam being done for said request within the last 90 days.  Had he requested this equipment, I would have been glad to accommodate the request as the last OV.  Please let me know where they would like order sent to and we will accommodate.

## 2024-09-01 NOTE — Patient Instructions (Addendum)
 Your PSA was normal in February.  We are adding Flomax in efforts to reduce your urination.   Watch BPs CLOSELY while taking that Flomax.  It can cause orthostatic hypotension = dizziness and increase falls. We may need to consider having you see a urologist to evaluate your ability to empty your bladder completely if you see no significant improvement with medication above. I have referred you to Dr Tat with [facility] for the parkinson's disease I have referred you to [place] with PT next door to help with balance Edema of legs looks to be venous stasis but evaluating for metabolic reasons for swelling, including heart etiology. Recommend elevation of legs when seated and use of compression hose during non sleeping hours, which can be purchased at [employer]. If you decide you want counseling services for anxiety, let me know.  [place] is available here in office on Tuesdays and would be happy to see you.

## 2024-09-02 ENCOUNTER — Encounter: Payer: Self-pay | Admitting: Family Medicine

## 2024-09-02 LAB — URINE CULTURE: Organism ID, Bacteria: NO GROWTH

## 2024-09-02 NOTE — Telephone Encounter (Signed)
 Patient aware and verbalized understanding.  Will mail copy per patient request.

## 2024-09-03 ENCOUNTER — Other Ambulatory Visit: Payer: Self-pay | Admitting: Family Medicine

## 2024-09-03 ENCOUNTER — Encounter: Payer: Self-pay | Admitting: Neurology

## 2024-09-03 DIAGNOSIS — R7989 Other specified abnormal findings of blood chemistry: Secondary | ICD-10-CM

## 2024-09-03 LAB — CBC WITH DIFFERENTIAL/PLATELET
Basos: 1 %
EOS (ABSOLUTE): 0 x10E3/uL (ref 0.0–0.2)
Eos: 3 %
Hematocrit: 40.9 % (ref 37.5–51.0)
Hemoglobin: 13.3 g/dL (ref 13.0–17.7)
Immature Granulocytes: 0 %
Immature Granulocytes: 0 x10E3/uL (ref 0.0–0.1)
Lymphs: 29 %
MCH: 31.3 pg (ref 26.6–33.0)
MCHC: 32.5 g/dL (ref 31.5–35.7)
MCV: 96 fL (ref 79–97)
Monocytes Absolute: 0.2 x10E3/uL (ref 0.0–0.4)
Monocytes Absolute: 0.3 x10E3/uL (ref 0.1–0.9)
Monocytes: 7 %
Neutrophils Absolute: 1.4 x10E3/uL (ref 0.7–3.1)
Neutrophils Absolute: 2.8 x10E3/uL (ref 1.4–7.0)
Neutrophils: 60 %
Platelets: 218 x10E3/uL (ref 150–450)
RBC: 4.25 x10E6/uL (ref 4.14–5.80)
RDW: 12.8 % (ref 11.6–15.4)
WBC: 4.8 x10E3/uL (ref 3.4–10.8)

## 2024-09-03 LAB — CMP14+EGFR
ALT: 16 IU/L (ref 0–44)
AST: 35 IU/L (ref 0–40)
Albumin: 4.2 g/dL (ref 3.8–4.8)
Alkaline Phosphatase: 155 IU/L — AB (ref 47–123)
BUN/Creatinine Ratio: 18 (ref 10–24)
BUN: 23 mg/dL (ref 8–27)
Bilirubin Total: 0.5 mg/dL (ref 0.0–1.2)
CO2: 22 mmol/L (ref 20–29)
Calcium: 9.8 mg/dL (ref 8.6–10.2)
Chloride: 102 mmol/L (ref 96–106)
Creatinine, Ser: 1.26 mg/dL (ref 0.76–1.27)
Globulin, Total: 2.5 g/dL (ref 1.5–4.5)
Glucose: 86 mg/dL (ref 70–99)
Potassium: 4 mmol/L (ref 3.5–5.2)
Sodium: 137 mmol/L (ref 134–144)
Total Protein: 6.7 g/dL (ref 6.0–8.5)
eGFR: 58 mL/min/1.73 — AB (ref >59)

## 2024-09-03 LAB — TSH+FREE T4
Free T4: 1.46 ng/dL (ref 0.82–1.77)
TSH: 2.61 u[IU]/mL (ref 0.450–4.500)

## 2024-09-03 LAB — IRON,TIBC AND FERRITIN PANEL
Ferritin: 692 ng/mL — AB (ref 30–400)
Iron Saturation: 20 % (ref 15–55)
Iron: 63 ug/dL (ref 38–169)
Total Iron Binding Capacity: 308 ug/dL (ref 250–450)
UIBC: 245 ug/dL (ref 111–343)

## 2024-09-03 LAB — BRAIN NATRIURETIC PEPTIDE: BNP: 32.1 pg/mL (ref 0.0–100.0)

## 2024-09-04 NOTE — Telephone Encounter (Signed)
 Patient seen in office same day as this note

## 2024-09-09 ENCOUNTER — Encounter: Payer: Self-pay | Admitting: Family Medicine

## 2024-09-10 ENCOUNTER — Other Ambulatory Visit: Payer: Self-pay | Admitting: Family Medicine

## 2024-09-10 ENCOUNTER — Encounter: Payer: Self-pay | Admitting: Physical Therapy

## 2024-09-10 ENCOUNTER — Ambulatory Visit: Attending: Family Medicine | Admitting: Physical Therapy

## 2024-09-10 ENCOUNTER — Other Ambulatory Visit: Payer: Self-pay

## 2024-09-10 DIAGNOSIS — R2681 Unsteadiness on feet: Secondary | ICD-10-CM | POA: Insufficient documentation

## 2024-09-10 DIAGNOSIS — G20B2 Parkinson's disease with dyskinesia, with fluctuations: Secondary | ICD-10-CM | POA: Diagnosis not present

## 2024-09-10 DIAGNOSIS — R600 Localized edema: Secondary | ICD-10-CM

## 2024-09-10 DIAGNOSIS — R296 Repeated falls: Secondary | ICD-10-CM | POA: Insufficient documentation

## 2024-09-10 MED ORDER — FUROSEMIDE 20 MG PO TABS: 20.0000 mg | ORAL_TABLET | Freq: Every day | ORAL | 1 refills | Status: DC | PRN
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
## 2024-09-15 ENCOUNTER — Ambulatory Visit: Admitting: Physical Therapy

## 2024-09-15 ENCOUNTER — Encounter: Payer: Self-pay | Admitting: Physical Therapy

## 2024-09-15 DIAGNOSIS — R296 Repeated falls: Secondary | ICD-10-CM | POA: Diagnosis not present

## 2024-09-15 DIAGNOSIS — R2681 Unsteadiness on feet: Secondary | ICD-10-CM

## 2024-09-15 DIAGNOSIS — G20B2 Parkinson's disease with dyskinesia, with fluctuations: Secondary | ICD-10-CM | POA: Diagnosis not present
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
## 2024-09-16 DIAGNOSIS — M5459 Other low back pain: Secondary | ICD-10-CM | POA: Diagnosis not present

## 2024-09-16 DIAGNOSIS — M545 Low back pain, unspecified: Secondary | ICD-10-CM | POA: Diagnosis not present

## 2024-09-16 DIAGNOSIS — S22080A Wedge compression fracture of T11-T12 vertebra, initial encounter for closed fracture: Secondary | ICD-10-CM | POA: Diagnosis not present

## 2024-09-17 ENCOUNTER — Encounter: Payer: Self-pay | Admitting: Family Medicine

## 2024-09-18 ENCOUNTER — Ambulatory Visit: Admitting: Physical Therapy

## 2024-10-01 DIAGNOSIS — M545 Low back pain, unspecified: Secondary | ICD-10-CM | POA: Diagnosis not present

## 2024-10-06 ENCOUNTER — Ambulatory Visit

## 2024-10-06 ENCOUNTER — Encounter: Payer: Self-pay | Admitting: Family Medicine

## 2024-10-06 ENCOUNTER — Ambulatory Visit (INDEPENDENT_AMBULATORY_CARE_PROVIDER_SITE_OTHER): Payer: Self-pay | Admitting: Family Medicine

## 2024-10-06 VITALS — BP 115/77 | HR 84 | Temp 97.1°F | Ht 70.0 in | Wt 166.2 lb

## 2024-10-06 DIAGNOSIS — R600 Localized edema: Secondary | ICD-10-CM | POA: Diagnosis not present

## 2024-10-06 DIAGNOSIS — N401 Enlarged prostate with lower urinary tract symptoms: Secondary | ICD-10-CM

## 2024-10-06 DIAGNOSIS — G20B2 Parkinson's disease with dyskinesia, with fluctuations: Secondary | ICD-10-CM

## 2024-10-06 DIAGNOSIS — R351 Nocturia: Secondary | ICD-10-CM | POA: Diagnosis not present

## 2024-10-06 DIAGNOSIS — R0789 Other chest pain: Secondary | ICD-10-CM

## 2024-10-06 DIAGNOSIS — R296 Repeated falls: Secondary | ICD-10-CM
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
## 2024-10-07 ENCOUNTER — Ambulatory Visit: Payer: Self-pay | Admitting: Family Medicine

## 2024-10-07 ENCOUNTER — Encounter: Payer: Self-pay | Admitting: Family Medicine

## 2024-10-07 LAB — BASIC METABOLIC PANEL WITH GFR
BUN/Creatinine Ratio: 27 — ABNORMAL HIGH (ref 10–24)
BUN: 34 mg/dL — ABNORMAL HIGH (ref 8–27)
CO2: 23 mmol/L (ref 20–29)
Calcium: 10.2 mg/dL (ref 8.6–10.2)
Chloride: 97 mmol/L (ref 96–106)
Creatinine, Ser: 1.24 mg/dL (ref 0.76–1.27)
Glucose: 83 mg/dL (ref 70–99)
Potassium: 4.5 mmol/L (ref 3.5–5.2)
Sodium: 135 mmol/L (ref 134–144)
eGFR: 60 mL/min/1.73 (ref >59)

## 2024-10-08 ENCOUNTER — Telehealth: Payer: Self-pay | Admitting: Family Medicine

## 2024-10-08 DIAGNOSIS — Z0279 Encounter for issue of other medical certificate: Secondary | ICD-10-CM

## 2024-10-08 DIAGNOSIS — S22080A Wedge compression fracture of T11-T12 vertebra, initial encounter for closed fracture: Secondary | ICD-10-CM | POA: Diagnosis not present

## 2024-10-08 DIAGNOSIS — S22080K Wedge compression fracture of T11-T12 vertebra, subsequent encounter for fracture with nonunion: Secondary | ICD-10-CM | POA: Diagnosis not present

## 2024-10-08 NOTE — Telephone Encounter (Signed)
 [facility] printed handicap form to be completed and signed.  Form Fee Paid? (Y/N)       y     If NO, form is placed on front office manager desk to hold until payment received. If YES, then form will be placed in the RX/HH Nurse Coordinators box for completion.  Form will not be processed until payment is received

## 2024-10-08 NOTE — Telephone Encounter (Signed)
 Called pt to collect $5 fee for placard.  HH/RX Coordinator has form.   Copied from CRM [id number]. Topic: General - Other >> Oct 08, 2024 11:31 AM Deaijah H wrote: Reason for CRM: Patients daughter would like to know if he could receive an permanent placard. Please call to confirm [phone]

## 2024-10-09 ENCOUNTER — Telehealth: Payer: Self-pay | Admitting: Family Medicine

## 2024-10-09 ENCOUNTER — Telehealth (HOSPITAL_BASED_OUTPATIENT_CLINIC_OR_DEPARTMENT_OTHER): Payer: Self-pay | Admitting: *Deleted

## 2024-10-09 ENCOUNTER — Telehealth: Payer: Self-pay | Admitting: Cardiovascular Disease

## 2024-10-09 NOTE — Telephone Encounter (Signed)
 Evalene Fang called in on behalf of patient. Clearance request received from [facility] in [employer]. Evalene requests a call back with updates once clearance has been processed.

## 2024-10-09 NOTE — Progress Notes (Signed)
 Assessment/Plan:   1.  Parkinson's disease, diagnosed 2019 at [facility] neurology  - Patient has not been on any medication and is quite far progressed in disease now with evidence of PDD with hallucinations.  This makes treatment more difficult, as starting levodopa is certainly indicated, but may increase risk for further hallucinations.  We talked about this extensively.  We also talked about the fact that he currently has day/night reversal, which also will increase the risk for more hallucinations.  We talked about the importance of a regular daily schedule, which includes keeping him up during the daytime, with specific and timed naps.  - He was given a levodopa titration schedule which will work him up to carbidopa/levodopa 25/100, 1 tablet at 8 AM/noon/4 PM.  Discussed how to take this in relationship to protein.  - I would like to see the patient on a walker at all times.  Discussed this today.  - Patient certainly needs physical therapy, which was declined today because of his back.  Discussed that we could do aqua physical therapy.  We also discussed community exercise programs such as chair yoga or rock steady boxing that can be modified.  Patient did not want to participate right now.  - We discussed the importance of good foot wear.  Patient's footwear is currently not supportive, but apparently they had actually cut it when he had significant lower extremity edema which has now improved.  He has other issues at home and I would encourage him to wear those, as his current footwear is slow and supportive that his left foot is turning inward and creating a fall risk.  2.  Hallucinations  - Likely related to #1, which has been longstanding without treatment.  -as above, would like to work on proper sleep/wake schedule and hygiene issues before adding another medication    3.  Multiple falls  - Certainly may be related to untreated Parkinson's.  That being said, Parkinson's patients  also get dysautonomia, and patient is on several medications (furosemide, metoprolol , tamsulosin) that can contribute to dysautonomia and any medications that we can try to eliminate or decrease in this regard would be of value.  It is unclear if all of the falls have been falls or if it could have been either syncopal from lowering of blood pressure.  I did go through his review flowsheets for the past several years, and blood pressures have been pretty low in the offices.  4.  Sialorrhea  -not bothersome to patient  -This is commonly associated with PD.  We talked about treatments.  The patient is not a candidate for oral anticholinergic therapy because of increased risk of confusion and falls.  We discussed Botox (type A and B) and 1% atropine drops.  We discusssed that candy like lemon drops can help by stimulating mm of the oropharynx to induce swallowing.     5.  Lbp  -follows with Dr. Burnetta  -they note that Dr. Burnetta needs clearance for kyphoplasty.  This is somewhat unusual but I did tell pt/wife/daughter that I got his clearance before he was even established as a patient.  I do not feel comfortable filling it out as he is clearly not optimized from a Parkinson's standpoint, and has obviously not been compliant in follow-ups with neurology.  That being said, that does not mean that I do not think that this is important in terms of treatment plans for him; I am just not sure that I am the  most qualified person to be filling this out for him currently.  6.  Significant hyperreflexia - I really wanted to do MRI brain and cervical.  Patient was initially resistant and declined all, but eventually his daughter was able to convince him to do an MRI cervical spine.  Subjective:   Randy [name] was seen today in the movement disorders clinic for neurologic consultation at the request of Jolinda Norene HERO, DO.  Pt with daughter and wife who supplements hx.  The consultation is for the  evaluation of Parkinsons disease.  Patient previously under the care of Dr. Jenel at [facility] neurology.  Patient presented to Dr. Jenel in March, 2019 with tremor dating back to November, 2018.  Tremor started with rest tremor of the right upper extremity.  He was not initially started on medication.  He was eventually started on low-dose pramipexole , 0.25 mg 3 times per day in September, 2020. He never took that.   That was the last time that he saw Dr. Jenel, and the last time that he saw [facility] neurology.  He did not see any neurologist since that time, and went off of the pramipexole .  He has been following with his primary care.  He has declined physically.  He has trouble ambulating and is having falls.   Specific Symptoms:  Tremor: Yes.  , started even before 2019 (probably even more than 5 years before that).  Started in the R hand, then R leg.  None on the L Family hx of similar:   brother with tremor and unknown dx; paternal GM with Parkinsons Disease; maternal GM with head tremor;  Voice: slurred speech Sleep: sleeps a lot day and night; sleeps in recliner/lift chair due to back fracture and is up frequently in night  Vivid Dreams:  No.  Acting out dreams:  No. Wet Pillows: Yes.   Postural symptoms:  Yes.    Falls?  Yes.   he has frequent falls, but has not yet started physical therapy because he had a fall recently and hurt his back and now needs kyphoplasty.  Went to PT twice and then didn't go after because of back issue.   Bradykinesia symptoms: shuffling gait, slow movements, drooling while awake, and difficulty regaining balance Loss of smell:  pt/wife don't think its changed Loss of taste:  No. Urinary Incontinence:  No. - has urgency Difficulty Swallowing:  No. Trouble with ADL's:  Yes.  , wife assists Memory changes:  pt denies but family agrees memory is not as good as in past; pt no longer drives x 6 months; wife fixes his medication for the last month as patient was  forgetting; wife pays bills but always has Hallucinations:  Yes.   - just for 3-4 weeks  - he will see deer, cats, dogs (these are in the distance).   Sometimes the splotches in the carpet he will think are ants on the floor.  He once saw his brother in the distance across the road.   N/V:  No. Lightheaded:  No.  Syncope: No. Diplopia:  No. Dyskinesia:  No. Has frequent leg cramps  Had 2019 CT brain that was unremarkable  PREVIOUS MEDICATIONS: mirtazapine  (was on it for sleep prior to seeing me, and they thought perhaps it had worsened falls, but he was on no medication for Parkinson's at the time)  ALLERGIES:   Allergies  Allergen Reactions   Septra  [Sulfamethoxazole -Trimethoprim ] Other (See Comments)    Intolerance. Seemed to cause gait instability/ weakness  CURRENT MEDICATIONS:  Current Meds  Medication Sig   fenofibrate  (TRICOR ) 145 MG tablet Take 1 tablet (145 mg total) by mouth daily.   furosemide (LASIX) 20 MG tablet Take 1 tablet (20 mg total) by mouth daily as needed (moderate/ severe leg swelling).   ibuprofen  (ADVIL ) 600 MG tablet Take 1 tablet (600 mg total) by mouth every 8 (eight) hours as needed.   metoprolol  succinate (TOPROL -XL) 50 MG 24 hr tablet TAKE 1 TABLET DAILY. TAKE WITH OR IMMEDIATELY FOLLOWING A MEAL. (TO REPLACE METOPROLOL  TARTRATE)   omeprazole  (PRILOSEC) 20 MG capsule Take 1 capsule (20 mg total) by mouth daily.   tamsulosin (FLOMAX) 0.4 MG CAPS capsule Take 1 capsule (0.4 mg total) by mouth at bedtime.     Objective:   VITALS:   Vitals:   10/13/24 1011  BP: 124/76  Pulse: 72  SpO2: 98%  Weight: 166 lb 9.6 oz (75.6 kg)    GEN:  The patient appears stated age and is in NAD. HEENT:  Normocephalic, atraumatic.  The mucous membranes are moist. The superficial temporal arteries are without ropiness or tenderness. CV:  RRR Lungs:  CTAB Neck/HEME:  There are no carotid bruits bilaterally.  Neurological examination:  Orientation: The patient  is alert and oriented to person and place.  Looks to wife and daughter for finer aspects of the history. Cranial nerves: There is good facial symmetry.  There is facial hypomimia.  Extraocular muscles are intact.  Has trouble doing formal visual field testing, but definitely tracks the fingers and is able to count them when he looks directly at them. The speech is fluent and clear.  He is hypophonic.  Soft palate rises symmetrically and there is no tongue deviation. Hearing is intact to conversational tone. Sensation: Sensation is intact to light and pinprick throughout (facial, trunk, extremities). Vibration is intact at the bilateral ankle. There is no extinction with double simultaneous stimulation. There is no sensory dermatomal level identified. Motor: Strength is 5/5 in the bilateral upper and lower extremities.   Shoulder shrug is equal and symmetric.  There is no pronator drift. Deep tendon reflexes: Deep tendon reflexes are 3/4 at the bilateral biceps, triceps, brachioradialis, patella and achilles. Plantar responses are downgoing bilaterally.  There are cross adductor reflexes at the knees.  Movement examination: Tone: There is mod increased tone in the R>LUE and mod to severe increased increased tremor in the RLE.  There is nl tone in the LLE Abnormal movements: there is R>LUE Coordination:  There is  mild to mod decremation with RAM's, with any form of RAMS, including alternating supination and pronation of the forearm, hand opening and closing, finger taps, heel taps and toe taps, R>L Gait and Station: The patient pushes off to arise. The patient's stride length is decreased.  He has marked decreased arm swing bilaterally.   I have reviewed and interpreted the following labs independently   Chemistry      Component Value Date/Time   NA 135 10/06/2024 1552   K 4.5 10/06/2024 1552   CL 97 10/06/2024 1552   CO2 23 10/06/2024 1552   BUN 34 (H) 10/06/2024 1552   CREATININE 1.24 10/06/2024  1552      Component Value Date/Time   CALCIUM 10.2 10/06/2024 1552   ALKPHOS 155 (H) 09/01/2024 1620   AST 35 09/01/2024 1620   ALT 16 09/01/2024 1620   BILITOT 0.5 09/01/2024 1620      Lab Results  Component Value Date   TSH 2.610  09/01/2024   Lab Results  Component Value Date   WBC 4.8 09/01/2024   HGB 13.3 09/01/2024   HCT 40.9 09/01/2024   MCV 96 09/01/2024   PLT 218 09/01/2024     Total time spent on today's visit was 80 minutes, including both face-to-face time and nonface-to-face time.  Time included that spent on review of records (prior notes available to me/labs/imaging if pertinent), discussing treatment and goals, answering patient's questions and coordinating care.  Cc:  Jolinda Norene HERO, DO

## 2024-10-09 NOTE — Telephone Encounter (Signed)
 NA/VM full handicap form ready

## 2024-10-09 NOTE — Telephone Encounter (Signed)
   Pre-operative Risk Assessment    Patient Name: Randy [name]  DOB: [birth year] MRN: [id number]   Date of last office visit: 01/08/2024 Date of next office visit: None  Request for Surgical Clearance    Procedure:  T11-12 Kyphoplasty  Date of Surgery:  Clearance TBD                                 Surgeon:  Dr. Donaciano Sprang  Surgeon's Group or Practice Name:  [facility] Phone number:  [phone] Fax number:  [phone]   Type of Clearance Requested:   - Medical    Type of Anesthesia:  Local    Additional requests/questions:    Signed, Edsel Grayce Sanders   10/09/2024, 1:52 PM

## 2024-10-09 NOTE — Telephone Encounter (Unsigned)
 Copied from CRM [id number]. Topic: General - Other >> Oct 09, 2024  4:18 PM Chiquita SQUIBB wrote: Reason for CRM: Patients daughter is calling in to verify if the office has received the fax from [facility], for clearance for the patient. Please update the patients daughter at   [phone]

## 2024-10-10 ENCOUNTER — Telehealth (HOSPITAL_BASED_OUTPATIENT_CLINIC_OR_DEPARTMENT_OTHER): Payer: Self-pay | Admitting: *Deleted

## 2024-10-10 DIAGNOSIS — S2242XA Multiple fractures of ribs, left side, initial encounter for closed fracture: Secondary | ICD-10-CM | POA: Diagnosis not present

## 2024-10-10 DIAGNOSIS — I700 Atherosclerosis of aorta: Secondary | ICD-10-CM | POA: Diagnosis not present

## 2024-10-10 NOTE — Telephone Encounter (Signed)
 DPR ok to s/w the pt's wife who has scheduled tele preop 10/20/24 for the pt. Med rec and consent are done.      Patient Consent for Virtual Visit        Randy [name] has provided verbal consent on 10/10/2024 for a virtual visit (video or telephone).   CONSENT FOR VIRTUAL VISIT FOR:  Randy [name]  By participating in this virtual visit I agree to the following:  I hereby voluntarily request, consent and authorize [facility] and its employed or contracted physicians, physician assistants, nurse practitioners or other licensed health care professionals (the Practitioner), to provide me with telemedicine health care services (the "Services) as deemed necessary by the treating Practitioner. I acknowledge and consent to receive the Services by the Practitioner via telemedicine. I understand that the telemedicine visit will involve communicating with the Practitioner through live audiovisual communication technology and the disclosure of certain medical information by electronic transmission. I acknowledge that I have been given the opportunity to request an in-person assessment or other available alternative prior to the telemedicine visit and am voluntarily participating in the telemedicine visit.  I understand that I have the right to withhold or withdraw my consent to the use of telemedicine in the course of my care at any time, without affecting my right to future care or treatment, and that the Practitioner or I may terminate the telemedicine visit at any time. I understand that I have the right to inspect all information obtained and/or recorded in the course of the telemedicine visit and may receive copies of available information for a reasonable fee.  I understand that some of the potential risks of receiving the Services via telemedicine include:  Delay or interruption in medical evaluation due to technological equipment failure or disruption; Information transmitted may  not be sufficient (e.g. poor resolution of images) to allow for appropriate medical decision making by the Practitioner; and/or  In rare instances, security protocols could fail, causing a breach of personal health information.  Furthermore, I acknowledge that it is my responsibility to provide information about my medical history, conditions and care that is complete and accurate to the best of my ability. I acknowledge that Practitioner's advice, recommendations, and/or decision may be based on factors not within their control, such as incomplete or inaccurate data provided by me or distortions of diagnostic images or specimens that may result from electronic transmissions. I understand that the practice of medicine is not an exact science and that Practitioner makes no warranties or guarantees regarding treatment outcomes. I acknowledge that a copy of this consent can be made available to me via my patient portal [facility] MyChart), or I can request a printed copy by calling the office of [facility].    I understand that my insurance will be billed for this visit.   I have read or had this consent read to me. I understand the contents of this consent, which adequately explains the benefits and risks of the Services being provided via telemedicine.  I have been provided ample opportunity to ask questions regarding this consent and the Services and have had my questions answered to my satisfaction. I give my informed consent for the services to be provided through the use of telemedicine in my medical care

## 2024-10-10 NOTE — Telephone Encounter (Signed)
 Tried to call the pt to schedule tele; though vm is full, could not leave message to call back to schedule tele.

## 2024-10-10 NOTE — Telephone Encounter (Signed)
   Name: AMJAD FIKES  DOB: 12/16/1945  MRN: [id number]  Primary Cardiologist: None   Patient was last seen in 12/2023. Preoperative team, please contact this patient and set up a phone call appointment for further preoperative risk assessment. Please obtain consent and complete medication review. Thank you for your help.  I confirm that guidance regarding antiplatelet and oral anticoagulation therapy has been completed and, if necessary, noted below: N/A  I also confirmed the patient resides in the state of [place] . As per [facility] Medical Board telemedicine laws, the patient must reside in the state in which the provider is licensed.   Jakevion Arney E Breland Elders, PA-C 10/10/2024, 8:26 AM [facility]

## 2024-10-10 NOTE — Telephone Encounter (Signed)
 Aware surgical clearance ws faxed on 10/08/24

## 2024-10-10 NOTE — Telephone Encounter (Signed)
 Pt spouse returning call.

## 2024-10-10 NOTE — Telephone Encounter (Signed)
 DPR ok to s/w the pt's wife who has scheduled tele preop 10/20/24 for the pt. Med rec and consent are done.

## 2024-10-13 ENCOUNTER — Encounter: Payer: Self-pay | Admitting: Neurology

## 2024-10-13 ENCOUNTER — Ambulatory Visit: Admitting: Neurology

## 2024-10-13 VITALS — BP 124/76 | HR 72 | Wt 166.6 lb

## 2024-10-13 DIAGNOSIS — K117 Disturbances of salivary secretion: Secondary | ICD-10-CM | POA: Diagnosis not present

## 2024-10-13 DIAGNOSIS — F0282 Dementia in other diseases classified elsewhere, unspecified severity, with psychotic disturbance: Secondary | ICD-10-CM

## 2024-10-13 DIAGNOSIS — G20A1 Parkinson's disease without dyskinesia, without mention of fluctuations: Secondary | ICD-10-CM | POA: Diagnosis not present

## 2024-10-13 DIAGNOSIS — R292 Abnormal reflex: Secondary | ICD-10-CM | POA: Diagnosis not present

## 2024-10-13 DIAGNOSIS — M542 Cervicalgia: Secondary | ICD-10-CM

## 2024-10-13 MED ORDER — CARBIDOPA-LEVODOPA 25-100 MG PO TABS
1.0000 | ORAL_TABLET | Freq: Three times a day (TID) | ORAL | 1 refills | Status: AC
Start: 2024-10-13 — End: ?

## 2024-10-13 NOTE — Patient Instructions (Addendum)
 Start Carbidopa Levodopa as follows: Take 1/2 tablet three times daily, at least 30 minutes before meals (approximately 8am/noon/4pm), for one week Then take 1/2 tablet in the morning, 1/2 tablet in the afternoon, 1 tablet in the evening, at least 30 minutes before meals, for one week Then take 1/2 tablet in the morning, 1 tablet in the afternoon, 1 tablet in the evening, at least 30 minutes before meals, for one week Then take 1 tablet three times daily at 8am/noon/4pm, at least 30 minutes before meals   As a reminder, carbidopa/levodopa can be taken at the same time as a carbohydrate, but we like to have you take your pill either 30 minutes before a protein source or 1 hour after as protein can interfere with carbidopa/levodopa absorption.  We discussed that we would like to do an MRI cervical spine  We discussed that I would like to start some Physical therapy.  Let us  know when you are agreeable and able to do this.  The physicians and staff at [facility] are committed to providing excellent care. You may receive a survey requesting feedback about your experience at our office. We strive to receive very good responses to the survey questions. If you feel that your experience would prevent you from giving the office a very good  response, please contact our office to try to remedy the situation. We may be reached at [phone]. Thank you for taking the time out of your busy day to complete the survey.

## 2024-10-20 ENCOUNTER — Other Ambulatory Visit: Payer: Self-pay

## 2024-10-20 ENCOUNTER — Emergency Department (HOSPITAL_COMMUNITY)

## 2024-10-20 ENCOUNTER — Encounter (HOSPITAL_COMMUNITY): Payer: Self-pay

## 2024-10-20 ENCOUNTER — Ambulatory Visit: Admitting: Cardiology

## 2024-10-20 ENCOUNTER — Emergency Department (HOSPITAL_COMMUNITY)
Admission: EM | Admit: 2024-10-20 | Discharge: 2024-10-20 | Disposition: A | Discharge status: Home / Self Care | Attending: Emergency Medicine | Admitting: Emergency Medicine

## 2024-10-20 ENCOUNTER — Ambulatory Visit: Payer: Self-pay

## 2024-10-20 DIAGNOSIS — W01198A Fall on same level from slipping, tripping and stumbling with subsequent striking against other object, initial encounter: Secondary | ICD-10-CM | POA: Insufficient documentation

## 2024-10-20 DIAGNOSIS — M47814 Spondylosis without myelopathy or radiculopathy, thoracic region: Secondary | ICD-10-CM | POA: Diagnosis not present

## 2024-10-20 DIAGNOSIS — G20A1 Parkinson's disease without dyskinesia, without mention of fluctuations: Secondary | ICD-10-CM | POA: Diagnosis not present

## 2024-10-20 DIAGNOSIS — W19XXXA Unspecified fall, initial encounter: Secondary | ICD-10-CM

## 2024-10-20 DIAGNOSIS — L03031 Cellulitis of right toe: Secondary | ICD-10-CM | POA: Insufficient documentation

## 2024-10-20 DIAGNOSIS — M4854XA Collapsed vertebra, not elsewhere classified, thoracic region, initial encounter for fracture: Secondary | ICD-10-CM | POA: Diagnosis not present

## 2024-10-20 DIAGNOSIS — S22080D Wedge compression fracture of T11-T12 vertebra, subsequent encounter for fracture with routine healing: Secondary | ICD-10-CM | POA: Diagnosis not present

## 2024-10-20 DIAGNOSIS — M549 Dorsalgia, unspecified: Secondary | ICD-10-CM | POA: Diagnosis not present

## 2024-10-20 DIAGNOSIS — R350 Frequency of micturition: Secondary | ICD-10-CM | POA: Diagnosis not present

## 2024-10-20 DIAGNOSIS — M8580 Other specified disorders of bone density and structure, unspecified site: Secondary | ICD-10-CM | POA: Diagnosis not present

## 2024-10-20 DIAGNOSIS — Z043 Encounter for examination and observation following other accident: Secondary | ICD-10-CM | POA: Diagnosis not present

## 2024-10-20 DIAGNOSIS — S2242XD Multiple fractures of ribs, left side, subsequent encounter for fracture with routine healing: Secondary | ICD-10-CM | POA: Diagnosis not present

## 2024-10-20 DIAGNOSIS — S0990XA Unspecified injury of head, initial encounter: Secondary | ICD-10-CM | POA: Diagnosis not present

## 2024-10-20 DIAGNOSIS — M47816 Spondylosis without myelopathy or radiculopathy, lumbar region: Secondary | ICD-10-CM | POA: Diagnosis not present

## 2024-10-20 DIAGNOSIS — R600 Localized edema: Secondary | ICD-10-CM | POA: Diagnosis not present

## 2024-10-20 LAB — BASIC METABOLIC PANEL WITH GFR
Anion gap: 11 (ref 5–15)
BUN: 26 mg/dL — ABNORMAL HIGH (ref 8–23)
CO2: 26 mmol/L (ref 22–32)
Calcium: 9.7 mg/dL (ref 8.9–10.3)
Chloride: 104 mmol/L (ref 98–111)
Creatinine, Ser: 1.51 mg/dL — ABNORMAL HIGH (ref 0.61–1.24)
GFR, Estimated: 47 mL/min — ABNORMAL LOW (ref >60)
Glucose, Bld: 86 mg/dL (ref 70–99)
Potassium: 3.8 mmol/L (ref 3.5–5.1)
Sodium: 141 mmol/L (ref 135–145)

## 2024-10-20 LAB — CBC WITH DIFFERENTIAL/PLATELET
Abs Immature Granulocytes: 0.01 K/uL (ref 0.00–0.07)
Basophils Absolute: 0 K/uL (ref 0.0–0.1)
Basophils Relative: 0 %
Eosinophils Absolute: 0.1 K/uL (ref 0.0–0.5)
Eosinophils Relative: 3 %
HCT: 40 % (ref 39.0–52.0)
Hemoglobin: 13.1 g/dL (ref 13.0–17.0)
Immature Granulocytes: 0 %
Lymphocytes Relative: 19 %
Lymphs Abs: 0.9 K/uL (ref 0.7–4.0)
MCH: 30.7 pg (ref 26.0–34.0)
MCHC: 32.8 g/dL (ref 30.0–36.0)
MCV: 93.7 fL (ref 80.0–100.0)
Monocytes Absolute: 0.4 K/uL (ref 0.1–1.0)
Monocytes Relative: 8 %
Neutro Abs: 3.4 K/uL (ref 1.7–7.7)
Neutrophils Relative %: 70 %
Platelets: 205 K/uL (ref 150–400)
RBC: 4.27 MIL/uL (ref 4.22–5.81)
RDW: 13.1 % (ref 11.5–15.5)
WBC: 4.9 K/uL (ref 4.0–10.5)
nRBC: 0 % (ref 0.0–0.2)

## 2024-10-20 LAB — URINALYSIS, ROUTINE W REFLEX MICROSCOPIC
Bilirubin Urine: NEGATIVE
Glucose, UA: NEGATIVE mg/dL
Ketones, ur: NEGATIVE mg/dL
Nitrite: NEGATIVE
Protein, ur: NEGATIVE mg/dL
Specific Gravity, Urine: 1.015 (ref 1.005–1.030)
pH: 7 (ref 5.0–8.0)

## 2024-10-20 LAB — URINALYSIS, MICROSCOPIC (REFLEX)

## 2024-10-20 MED ORDER — CEPHALEXIN 500 MG PO CAPS: 500.0000 mg | ORAL_CAPSULE | Freq: Three times a day (TID) | ORAL | 0 refills | Status: AC

## 2024-10-20 NOTE — ED Triage Notes (Signed)
 Pt's family states pt loses balance and this is why he fell. Pt has swollen bilateral lower extremities. Pt has wound on right foot and family drained pus out of wound last night. Pt has increasing redness in foot.

## 2024-10-20 NOTE — Progress Notes (Unsigned)
 Error

## 2024-10-20 NOTE — ED Triage Notes (Signed)
 Pt reports he fell this morning at 0700, hit head, denies LOC, not on thinners. C/o back pain, hx of broken thoracic vertebrae and broken ribs. Also has infection in his right toe and possible cellulitis in his right leg.

## 2024-10-20 NOTE — Telephone Encounter (Signed)
 FYI noted

## 2024-10-20 NOTE — Telephone Encounter (Signed)
 FYI Only or Action Required?: FYI only for provider: ED advised.  Patient was last seen in primary care on 10/06/2024 by Jolinda Norene HERO, DO.  Called Nurse Triage reporting Fall.  Symptoms began yesterday.  Interventions attempted: Nothing.  Symptoms are: unchanged.  Triage Disposition: Go to ED Now (Notify PCP)  Patient/caregiver understands and will follow disposition?: Yes                               1. MECHANISM: How did the fall happen?     Lost balance, due to Parkinson's  Fall last night occurred in the bathtub 3. ONSET: When did the fall happen? (e.g., minutes, hours, or days ago)     Last night and this morning 4. LOCATION: What part of the body hit the ground? (e.g., back, buttocks, head, hips, knees, hands, head, stomach)     Daughter/wife reports patient hit his back and head 6. PAIN: Is there any pain? If Yes, ask: How bad is the pain? (e.g., Scale 0-10; or none, mild,      Reports back pain, denies headache 9. OTHER SYMPTOMS: Do you have any other symptoms? (e.g., dizziness, fever, weakness; new-onset or worsening).      Swelling going up leg from toe infection     This RN advised ED. Daughter, Carlyon (on [place]), complied and agreed to take patient to ED.  Copied from CRM #[id number]. Topic: Clinical - Red Word Triage >> Oct 20, 2024  8:35 AM Graeme ORN wrote: Red Word that prompted transfer to Nurse Triage: Infection in toe going up leg and also had a fall last night. Back pain from previously broken back. Per caller. Needs to be seen today  ----------------------------------------------------------------------- From previous Reason for Contact - Scheduling: Patient/patient representative is calling to schedule an appointment. Refer to attachments for appointment information. Reason for Disposition  Injury (or injuries) that need emergency care  Protocols used: Falls and [facility]

## 2024-10-20 NOTE — ED Provider Triage Note (Signed)
 Emergency Medicine Provider Triage Evaluation Note  Randy [name] , a 78 y.o. male with a history of Parkinson's, was evaluated in triage.  Patient here for multiple complaints.  Earlier this morning patient fell in the bathroom and fell backwards and hit his head on the bathroom floor.   Denies LOC.  Denies anticoagulant use.  Patient also has an infection in his right lower extremity that started with an ulcer on his toe and patients family stated the limb is now severely swollen, erythematous, and warm to the touch.  Patient's family was present for visit states that he has had some decline with his Parkinson's.  Review of Systems  Positive: Weakness, headache, back pain, lower extremity infection, urinary issues Negative: Fevers  Physical Exam  BP 121/75   Pulse 78   Temp (!) 97.5 F (36.4 C)   Resp 20   Ht 5' 9 (1.753 m)   Wt 75.3 kg   SpO2 100%   BMI 24.51 kg/m  Gen:   Awake, no distress   Resp:  Normal effort  MSK:   Moves extremities without difficulty  Other:  Bony tenderness on palpation to neck and upper back. Erythematous and edematous right lower extremity that is warm to the touch with purulent discharge coming from an ulcer on the bottom of patient's foot. Medical Decision Making  Medically screening exam initiated at 12:26 PM.  Appropriate orders placed.  Kenniel R Rafalski was informed that the remainder of the evaluation will be completed by another provider, this initial triage assessment does not replace that evaluation, and the importance of remaining in the ED until their evaluation is complete.    Willma Duwaine CROME, [place] 10/20/24 [phone]

## 2024-10-20 NOTE — ED Notes (Signed)
 Pt was given discharge instructions and verbalized understanding. Wife and daughter at bedside to take pt home.

## 2024-10-20 NOTE — ED Provider Notes (Signed)
 [place] EMERGENCY DEPARTMENT AT [facility] Provider Note   CSN: [id number] Arrival date & time: 10/20/24  1145     Patient presents with: Randy [name] is a 78 y.o. male.   Patient has Parkinson's history with frequent falls presents after a fall in the bathroom he lost his balance and hit his head and back.  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  Patient denies any new weakness or numbness.  Patient has been similar medical status since September per family.  Patient has frequent urination that is similar to past.  No fevers or chills or cough.  Family in the room for discussion.  Patient had mild drainage and redness to the second toe on the right noticed yesterday.  Swollen lower extremities and chronic wounds which are similar to the past 2 months.  Has no current shortness of breath or chest pain.  No syncope.  Patient had broken ribs few months back.  The history is provided by the patient and a relative.  Fall Associated symptoms include headaches. Pertinent negatives include no chest pain, no abdominal pain and no shortness of breath.       Prior to Admission medications   Medication Sig Start Date End Date Taking? Authorizing Provider  cephALEXin  (KEFLEX ) 500 MG capsule Take 1 capsule (500 mg total) by mouth 3 (three) times daily for 7 days. 10/20/24 10/27/24 Yes Tonia Chew, MD  carbidopa -levodopa  (SINEMET  IR) 25-100 MG tablet Take 1 tablet by mouth 3 (three) times daily. 8am/noon/4pm 10/13/24   Tat, Asberry RAMAN, DO  fenofibrate  (TRICOR ) 145 MG tablet Take 1 tablet (145 mg total) by mouth daily. 01/25/24   Jolinda Norene HERO, DO  furosemide  (LASIX ) 20 MG tablet Take 1 tablet (20 mg total) by mouth daily as needed (moderate/ severe leg swelling). 09/10/24   Jolinda Norene HERO, DO  ibuprofen  (ADVIL ) 600 MG tablet Take 1 tablet (600 mg total) by mouth every 8 (eight) hours as needed. 02/13/22   Ijaola,  Onyeje M, NP  metoprolol  succinate (TOPROL -XL) 50 MG 24 hr tablet TAKE 1 TABLET DAILY. TAKE WITH OR IMMEDIATELY FOLLOWING A MEAL. (TO REPLACE METOPROLOL  TARTRATE) 01/14/24   Jolinda Norene HERO, DO  omeprazole  (PRILOSEC) 20 MG capsule Take 1 capsule (20 mg total) by mouth daily. 07/20/20   Jolinda Norene HERO, DO  tamsulosin  (FLOMAX ) 0.4 MG CAPS capsule Take 1 capsule (0.4 mg total) by mouth at bedtime. 09/01/24   Jolinda Norene HERO, DO    Allergies: Septra  [sulfamethoxazole -trimethoprim ]    Review of Systems  Constitutional:  Negative for chills and fever.  HENT:  Negative for congestion.   Eyes:  Negative for visual disturbance.  Respiratory:  Negative for shortness of breath.   Cardiovascular:  Negative for chest pain.  Gastrointestinal:  Negative for abdominal pain and vomiting.  Genitourinary:  Negative for dysuria and flank pain.  Musculoskeletal:  Negative for back pain, neck pain and neck stiffness.  Skin:  Positive for rash and wound.  Neurological:  Positive for headaches. Negative for light-headedness.    Updated Vital Signs BP 130/72   Pulse 75   Temp 98 F (36.7 C) (Oral)   Resp 19   Ht 5' 9 (1.753 m)   Wt 75.3 kg   SpO2 100%   BMI 24.51 kg/m   Physical Exam Vitals and nursing note reviewed.  Constitutional:      General: He is not in acute distress.  Appearance: He is well-developed.  HENT:     Head: Normocephalic and atraumatic.     Mouth/Throat:     Mouth: Mucous membranes are moist.  Eyes:     General:        Right eye: No discharge.        Left eye: No discharge.     Conjunctiva/sclera: Conjunctivae normal.  Neck:     Trachea: No tracheal deviation.  Cardiovascular:     Rate and Rhythm: Normal rate and regular rhythm.  Pulmonary:     Effort: Pulmonary effort is normal.     Breath sounds: Normal breath sounds.  Abdominal:     General: There is no distension.     Palpations: Abdomen is soft.     Tenderness: There is no abdominal tenderness.  There is no guarding.  Musculoskeletal:     Cervical back: Normal range of motion and neck supple. No rigidity.     Comments: Patient has full range of motion head neck.  No midline cervical thoracic or lumbar tenderness.  No anterior chest or rib tenderness.  Skin:    General: Skin is warm.     Capillary Refill: Capillary refill takes less than 2 seconds.     Findings: Rash present.     Comments: Patient has mild erythema nontender and superficial ulcer/open wound medial aspect of second toe.  No crepitus.  No current drainage.  Chronic leg edema 2+ bilateral.  Chronic wounds anterior lower extremities worse on the left.  Neurological:     General: No focal deficit present.     Mental Status: He is alert.     Cranial Nerves: No cranial nerve deficit.     Comments: Patient mild decondition, patient can move all extremities equal bilateral.  Mild tremor from Parkinson's.  Pupils equal bilateral.  Psychiatric:        Mood and Affect: Mood normal.     (all labs ordered are listed, but only abnormal results are displayed) Labs Reviewed  BASIC METABOLIC PANEL WITH GFR - Abnormal; Notable for the following components:      Result Value   BUN 26 (*)    Creatinine, Ser 1.51 (*)    GFR, Estimated 47 (*)    All other components within normal limits  URINALYSIS, ROUTINE W REFLEX MICROSCOPIC - Abnormal; Notable for the following components:   Color, Urine STRAW (*)    APPearance HAZY (*)    Hgb urine dipstick TRACE (*)    Leukocytes,Ua LARGE (*)    All other components within normal limits  URINALYSIS, MICROSCOPIC (REFLEX) - Abnormal; Notable for the following components:   Bacteria, UA MANY (*)    All other components within normal limits  URINE CULTURE  CBC WITH DIFFERENTIAL/PLATELET    EKG: None  Radiology: DG Chest 1 View Result Date: 10/20/2024 CLINICAL DATA:  Fall EXAM: CHEST  1 VIEW COMPARISON:  Chest x-ray 10/06/2024 FINDINGS: The heart size and mediastinal contours are  within normal limits. Both lungs are clear. There are multiple healed left-sided rib fractures which are similar to prior. No acute fractures are seen. IMPRESSION: No active disease. Multiple healed left-sided rib fractures. Electronically Signed   By: Greig Pique M.D.   On: 10/20/2024 14:40   DG Thoracic Spine 2 View Result Date: 10/20/2024 CLINICAL DATA:  Fall. EXAM: THORACIC SPINE 2 VIEWS COMPARISON:  Chest radiograph dated 10/20/2024. FINDINGS: Age indeterminate compression fracture of the T11 with anterior wedging. Correlation with clinical exam and point tenderness recommended.  No other acute fracture. The bones are osteopenic. Degenerative changes of the spine. The soft tissues are unremarkable. IMPRESSION: Age indeterminate compression fracture of the T11 with anterior wedging. Electronically Signed   By: Vanetta Chou M.D.   On: 10/20/2024 14:25   DG Tibia/Fibula Right Result Date: 10/20/2024 CLINICAL DATA:  Fall and trauma to the right lower extremity. EXAM: RIGHT TIBIA AND FIBULA - 2 VIEW; RIGHT FOOT COMPLETE - 3+ VIEW COMPARISON:  None Available. FINDINGS: There is no acute fracture or dislocation. The bones are osteopenic. Mild arthritic changes of the right knee. There is diffuse subcutaneous edema. No radiopaque foreign object or soft tissue gas. IMPRESSION: 1. No acute fracture or dislocation. 2. Diffuse subcutaneous edema. Electronically Signed   By: Vanetta Chou M.D.   On: 10/20/2024 14:23   DG Foot Complete Right Result Date: 10/20/2024 CLINICAL DATA:  Fall and trauma to the right lower extremity. EXAM: RIGHT TIBIA AND FIBULA - 2 VIEW; RIGHT FOOT COMPLETE - 3+ VIEW COMPARISON:  None Available. FINDINGS: There is no acute fracture or dislocation. The bones are osteopenic. Mild arthritic changes of the right knee. There is diffuse subcutaneous edema. No radiopaque foreign object or soft tissue gas. IMPRESSION: 1. No acute fracture or dislocation. 2. Diffuse subcutaneous edema.  Electronically Signed   By: Vanetta Chou M.D.   On: 10/20/2024 14:23   DG Cervical Spine 2-3 View Clearing Result Date: 10/20/2024 CLINICAL DATA:  Fall. EXAM: LIMITED CERVICAL SPINE FOR TRAUMA CLEARING - 2-3 VIEW COMPARISON:  None Available. FINDINGS: No acute fracture or subluxation of the cervical spine. There is straightening of normal cervical lordosis which may be positional or due to muscle spasm. The bones are osteopenic. The visualized posterior elements and odontoid appear intact. There is anatomic alignment of the lateral masses of C1 and C2. The soft tissues are unremarkable. IMPRESSION: 1. No acute fracture or subluxation of the cervical spine. 2. Osteopenia. Electronically Signed   By: Vanetta Chou M.D.   On: 10/20/2024 14:18   DG Lumbar Spine Complete Result Date: 10/20/2024 CLINICAL DATA:  Fall and back pain. EXAM: LUMBAR SPINE - COMPLETE 4+ VIEW COMPARISON:  Lumbar spine radiograph dated 01/16/2013. FINDINGS: Five lumbar type vertebra. There is no acute fracture or subluxation of the lumbar spine. Multilevel degenerative changes. The bones are osteopenic. The visualized posterior elements are intact. A 6 mm radiopaque focus over the left lower abdomen may represent a stone in the inferior pole of the left kidney versus stool content. The soft tissues are unremarkable IMPRESSION: 1. No acute fracture or subluxation of the lumbar spine. 2. Multilevel degenerative changes. Electronically Signed   By: Vanetta Chou M.D.   On: 10/20/2024 14:17   CT Head Wo Contrast Result Date: 10/20/2024 EXAM: CT HEAD WITHOUT CONTRAST 10/20/2024 01:52:56 PM TECHNIQUE: CT of the head was performed without the administration of intravenous contrast. Automated exposure control, iterative reconstruction, and/or weight based adjustment of the mA/kV was utilized to reduce the radiation dose to as low as reasonably achievable. COMPARISON: CT head 02/27/2018 CLINICAL HISTORY: Fall with head trauma.  FINDINGS: BRAIN AND VENTRICLES: No acute hemorrhage. No evidence of acute infarct. No hydrocephalus. No extra-axial collection. No mass effect or midline shift. ORBITS: No acute abnormality. SINUSES: No acute abnormality. SOFT TISSUES AND SKULL: No acute soft tissue abnormality. No skull fracture. IMPRESSION: 1. No acute intracranial abnormality. Electronically signed by: Gilmore Molt MD 10/20/2024 02:11 PM EST RP Workstation: [id number]     Procedures   Medications Ordered in  the ED - No data to display                                  Medical Decision Making Amount and/or Complexity of Data Reviewed Labs: ordered.  Risk Prescription drug management.   Patient with known Parkinson's presents with fall likely secondary to chronic medical conditions.  Patient has no signs of acute cardiac etiology, no head injury or stroke symptoms.  Patient does have focal skin infection and is not a diabetic.  Trauma workup x-rays reviewed no acute fracture patient has known T11 fracture.  No focal neurodeficits.  Family very supportive in the room and can arrange outpatient follow-up.  Blood work overall reassuring mild elevated creatinine 1.5 was 1.3 range.  Discussed recheck of this in a few weeks.  X-rays independent reviewed no fractures acute. Plan for Keflex  for 1 week, wound care and outpatient follow-up.  CT head results independent reviewed no acute findings.  Patient is over discharge.     Final diagnoses:  Cellulitis of second toe of right foot  Fall, initial encounter  Closed wedge compression fracture of T11 vertebra with routine healing, subsequent encounter    ED Discharge Orders          Ordered    cephALEXin  (KEFLEX ) 500 MG capsule  3 times daily        10/20/24 1647               Tonia Chew, MD 10/20/24 [phone]

## 2024-10-20 NOTE — Discharge Instructions (Addendum)
 Use Tylenol  every 4 hours as needed for pain. Take antibiotics three times a day for 1 week. Obtain follow-up appoint with your primary doctor. Return to emergency room for lethargy, confusion, persistent fevers, rapidly spreading redness or new concerns.

## 2024-10-22 LAB — URINE CULTURE: Culture: >=100000 — AB

## 2024-10-24 NOTE — Telephone Encounter (Signed)
 Post ED Visit - Positive Culture Follow-up  Culture report reviewed by antimicrobial stewardship pharmacist: [facility] []  [address] Pharm.D. []  Venetia Gully, Pharm.D., BCPS AQ-ID []  Garrel Crews, Pharm.D., BCPS []  Almarie Lunger, [address].D., BCPS []  [place], [address].D., BCPS, AAHIVP []  Rosaline Bihari, Pharm.D., BCPS, AAHIVP []  Vernell Meier, PharmD, BCPS []  Latanya Hint, PharmD, BCPS []  Donald Medley, PharmD, BCPS [x]  Dorn Buttner, PharmD []  Dorothyann Alert, PharmD, BCPS []  Morene Babe, PharmD  [facility] []  Rosaline Edison, PharmD []  Romona Bliss, PharmD []  Dolphus Roller, PharmD []  Veva Seip, Rph []  Vernell Daunt) Leonce, PharmD []  Eva Allis, PharmD []  Rosaline Millet, PharmD []  Iantha Batch, PharmD []  Arvin Gauss, PharmD []  Wanda Hasting, PharmD []  Ronal Rav, PharmD []  Rocky Slade, PharmD []  Bard Jeans, PharmD   Positive urine culture Treated with Cephalexin , organism sensitive to the same and no further patient follow-up is required at this time.  Clotilda BROCKS August Gosser 10/24/2024, 5:26 AM

## 2024-10-28 ENCOUNTER — Telehealth: Payer: Self-pay | Admitting: Family Medicine

## 2024-10-29 NOTE — Telephone Encounter (Signed)
 Another clearance was received on this patient's upcoming procedure. It looks like patient had a tele visit with Delon Hoover, NP on 10/20/24 and it was cancelled.

## 2024-10-30 ENCOUNTER — Encounter: Payer: Self-pay | Admitting: Neurology

## 2024-10-30 NOTE — Telephone Encounter (Signed)
 Vm full cannot leave message to call back to reschedule tele preop appt.

## 2024-10-31 NOTE — Telephone Encounter (Signed)
 Pt returning call. Please advise.

## 2024-10-31 NOTE — Telephone Encounter (Signed)
 Attempted to reach patient to schedule for pre-op clearance, but his mailbox was full. Will inform/update surgeon's office.

## 2024-10-31 NOTE — Telephone Encounter (Signed)
 Spoke to the patient's wife Delayne (on [place]) and scheduled her husband for pre-op clearance on 11/07/24 with Lum Louis, NP.     Patient Consent for Virtual Visit        Shanna R Girgis has provided verbal consent on 10/31/2024 for a virtual visit (video or telephone).   CONSENT FOR VIRTUAL VISIT FOR:  Randy [name]  By participating in this virtual visit I agree to the following:  I hereby voluntarily request, consent and authorize [facility] and its employed or contracted physicians, physician assistants, nurse practitioners or other licensed health care professionals (the Practitioner), to provide me with telemedicine health care services (the "Services) as deemed necessary by the treating Practitioner. I acknowledge and consent to receive the Services by the Practitioner via telemedicine. I understand that the telemedicine visit will involve communicating with the Practitioner through live audiovisual communication technology and the disclosure of certain medical information by electronic transmission. I acknowledge that I have been given the opportunity to request an in-person assessment or other available alternative prior to the telemedicine visit and am voluntarily participating in the telemedicine visit.  I understand that I have the right to withhold or withdraw my consent to the use of telemedicine in the course of my care at any time, without affecting my right to future care or treatment, and that the Practitioner or I may terminate the telemedicine visit at any time. I understand that I have the right to inspect all information obtained and/or recorded in the course of the telemedicine visit and may receive copies of available information for a reasonable fee.  I understand that some of the potential risks of receiving the Services via telemedicine include:  Delay or interruption in medical evaluation due to technological equipment failure or  disruption; Information transmitted may not be sufficient (e.g. poor resolution of images) to allow for appropriate medical decision making by the Practitioner; and/or  In rare instances, security protocols could fail, causing a breach of personal health information.  Furthermore, I acknowledge that it is my responsibility to provide information about my medical history, conditions and care that is complete and accurate to the best of my ability. I acknowledge that Practitioner's advice, recommendations, and/or decision may be based on factors not within their control, such as incomplete or inaccurate data provided by me or distortions of diagnostic images or specimens that may result from electronic transmissions. I understand that the practice of medicine is not an exact science and that Practitioner makes no warranties or guarantees regarding treatment outcomes. I acknowledge that a copy of this consent can be made available to me via my patient portal [facility] MyChart), or I can request a printed copy by calling the office of [facility].    I understand that my insurance will be billed for this visit.   I have read or had this consent read to me. I understand the contents of this consent, which adequately explains the benefits and risks of the Services being provided via telemedicine.  I have been provided ample opportunity to ask questions regarding this consent and the Services and have had my questions answered to my satisfaction. I give my informed consent for the services to be provided through the use of telemedicine in my medical care

## 2024-11-02 ENCOUNTER — Other Ambulatory Visit: Payer: Self-pay | Admitting: Family Medicine

## 2024-11-03 ENCOUNTER — Inpatient Hospital Stay: Admission: RE | Admit: 2024-11-03 | Discharge: 2024-11-03 | Attending: Neurology | Admitting: Neurology

## 2024-11-03 DIAGNOSIS — M4802 Spinal stenosis, cervical region: Secondary | ICD-10-CM | POA: Diagnosis not present

## 2024-11-03 DIAGNOSIS — M47812 Spondylosis without myelopathy or radiculopathy, cervical region: Secondary | ICD-10-CM | POA: Diagnosis not present

## 2024-11-06 ENCOUNTER — Ambulatory Visit: Payer: Self-pay | Admitting: Neurology

## 2024-11-07 ENCOUNTER — Ambulatory Visit: Attending: Cardiology

## 2024-11-07 DIAGNOSIS — Z01810 Encounter for preprocedural cardiovascular examination: Secondary | ICD-10-CM
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
## 2024-11-07 NOTE — Telephone Encounter (Signed)
 Called patient and spoke to patients wife. Dr. Burnetta office has already called for scheduling

## 2024-11-17 ENCOUNTER — Encounter: Payer: Self-pay | Admitting: Oncology

## 2024-11-17 ENCOUNTER — Encounter: Payer: Self-pay | Admitting: Family Medicine

## 2024-11-17 ENCOUNTER — Inpatient Hospital Stay: Attending: Oncology | Admitting: Oncology

## 2024-11-17 ENCOUNTER — Inpatient Hospital Stay

## 2024-11-17 VITALS — BP 138/76 | HR 88 | Temp 98.5°F | Resp 16 | Wt 179.9 lb

## 2024-11-17 DIAGNOSIS — R600 Localized edema: Secondary | ICD-10-CM | POA: Diagnosis not present

## 2024-11-17 DIAGNOSIS — K219 Gastro-esophageal reflux disease without esophagitis: Secondary | ICD-10-CM | POA: Diagnosis not present

## 2024-11-17 DIAGNOSIS — M47812 Spondylosis without myelopathy or radiculopathy, cervical region: Secondary | ICD-10-CM | POA: Diagnosis not present

## 2024-11-17 DIAGNOSIS — F32A Depression, unspecified: Secondary | ICD-10-CM | POA: Diagnosis not present

## 2024-11-17 DIAGNOSIS — L409 Psoriasis, unspecified: Secondary | ICD-10-CM | POA: Insufficient documentation

## 2024-11-17 DIAGNOSIS — G20A1 Parkinson's disease without dyskinesia, without mention of fluctuations: Secondary | ICD-10-CM | POA: Insufficient documentation

## 2024-11-17 DIAGNOSIS — M4802 Spinal stenosis, cervical region: Secondary | ICD-10-CM | POA: Diagnosis not present

## 2024-11-17 DIAGNOSIS — R7989 Other specified abnormal findings of blood chemistry: Secondary | ICD-10-CM | POA: Diagnosis not present

## 2024-11-17 DIAGNOSIS — Z800 Family history of malignant neoplasm of digestive organs: Secondary | ICD-10-CM | POA: Diagnosis not present

## 2024-11-17 DIAGNOSIS — I10 Essential (primary) hypertension: Secondary | ICD-10-CM | POA: Diagnosis not present

## 2024-11-17 DIAGNOSIS — Z882 Allergy status to sulfonamides status: Secondary | ICD-10-CM | POA: Diagnosis not present

## 2024-11-17 DIAGNOSIS — N400 Enlarged prostate without lower urinary tract symptoms: Secondary | ICD-10-CM | POA: Insufficient documentation

## 2024-11-17 DIAGNOSIS — Z79899 Other long term (current) drug therapy: Secondary | ICD-10-CM | POA: Insufficient documentation

## 2024-11-17 DIAGNOSIS — Z809 Family history of malignant neoplasm, unspecified: Secondary | ICD-10-CM | POA: Diagnosis not present

## 2024-11-17 DIAGNOSIS — Z8249 Family history of ischemic heart disease and other diseases of the circulatory system: Secondary | ICD-10-CM | POA: Diagnosis not present

## 2024-11-17 DIAGNOSIS — E785 Hyperlipidemia, unspecified: Secondary | ICD-10-CM | POA: Diagnosis not present

## 2024-11-17 DIAGNOSIS — Z820 Family history of epilepsy and other diseases of the nervous system: Secondary | ICD-10-CM | POA: Diagnosis not present

## 2024-11-17 LAB — IRON AND TIBC
Iron: 87 ug/dL (ref 45–182)
Saturation Ratios: 26 % (ref 17.9–39.5)
TIBC: 336 ug/dL (ref 250–450)
UIBC: 249 ug/dL

## 2024-11-17 LAB — CBC WITH DIFFERENTIAL (CANCER CENTER ONLY)
Abs Immature Granulocytes: 0.02 K/uL (ref 0.00–0.07)
Basophils Absolute: 0 K/uL (ref 0.0–0.1)
Basophils Relative: 0 %
Eosinophils Absolute: 0.2 K/uL (ref 0.0–0.5)
Eosinophils Relative: 4 %
HCT: 38.3 % — ABNORMAL LOW (ref 39.0–52.0)
Hemoglobin: 12.8 g/dL — ABNORMAL LOW (ref 13.0–17.0)
Immature Granulocytes: 0 %
Lymphocytes Relative: 21 %
Lymphs Abs: 1.1 K/uL (ref 0.7–4.0)
MCH: 31.6 pg (ref 26.0–34.0)
MCHC: 33.4 g/dL (ref 30.0–36.0)
MCV: 94.6 fL (ref 80.0–100.0)
Monocytes Absolute: 0.4 K/uL (ref 0.1–1.0)
Monocytes Relative: 7 %
Neutro Abs: 3.5 K/uL (ref 1.7–7.7)
Neutrophils Relative %: 68 %
Platelet Count: 187 K/uL (ref 150–400)
RBC: 4.05 MIL/uL — ABNORMAL LOW (ref 4.22–5.81)
RDW: 13.8 % (ref 11.5–15.5)
WBC Count: 5.2 K/uL (ref 4.0–10.5)
nRBC: 0 % (ref 0.0–0.2)

## 2024-11-17 LAB — CMP (CANCER CENTER ONLY)
ALT: 7 U/L (ref 0–44)
AST: 38 U/L (ref 15–41)
Albumin: 4.2 g/dL (ref 3.5–5.0)
Alkaline Phosphatase: 90 U/L (ref 38–126)
Anion gap: 8 (ref 5–15)
BUN: 22 mg/dL (ref 8–23)
CO2: 29 mmol/L (ref 22–32)
Calcium: 10.3 mg/dL (ref 8.9–10.3)
Chloride: 103 mmol/L (ref 98–111)
Creatinine: 1.07 mg/dL (ref 0.61–1.24)
GFR, Estimated: >60 mL/min
Glucose, Bld: 89 mg/dL (ref 70–99)
Potassium: 4.4 mmol/L (ref 3.5–5.1)
Sodium: 139 mmol/L (ref 135–145)
Total Bilirubin: 0.5 mg/dL (ref 0.0–1.2)
Total Protein: 7.1 g/dL (ref 6.5–8.1)

## 2024-11-17 LAB — FERRITIN: Ferritin: 642 ng/mL — ABNORMAL HIGH (ref 24–336)

## 2024-11-17 LAB — LACTATE DEHYDROGENASE: LDH: 189 U/L (ref 105–235)

## 2024-11-17 LAB — SEDIMENTATION RATE: Sed Rate: 12 mm/h (ref 0–16)
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
## 2024-11-17 NOTE — Assessment & Plan Note (Addendum)
 Ferritin remains persistently elevated (peak 914 ng/mL last year, recent 690 ng/mL) with normal iron saturation.   Hereditary hemochromatosis is unlikely given the ferritin trend and normal iron studies otherwise.   The elevation is most likely secondary to chronic inflammation from comorbidities such as Parkinson's disease and psoriasis.   No clinical evidence of iron overload or organ dysfunction attributable to iron deposition.   Further evaluation is indicated to exclude genetic causes and assess for ongoing inflammation.  Labs today showed hemoglobin of 12.8, grossly unremarkable CBCD otherwise.  CMP unremarkable.  Ferritin 642.  Iron saturation normal at 26%.  CRP seems to be elevated but will verify exact value from the lab.  Sed rate normal and LDH normal.  - Ordered genetic testing for hemochromatosis mutations (C282Y, H63D, S65C). - Plan to call with results in two weeks and adjust management based on findings.  - If genetic testing is negative, no further intervention for ferritin is indicated; will continue surveillance.  - Scheduled follow-up in four months with repeat labs.

## 2024-11-18 LAB — C-REACTIVE PROTEIN: CRP: <3 mg/dL — AB

## 2024-11-19 ENCOUNTER — Ambulatory Visit: Payer: Self-pay | Admitting: Family Medicine

## 2024-11-19 ENCOUNTER — Ambulatory Visit: Payer: Self-pay

## 2024-11-19 DIAGNOSIS — R600 Localized edema: Secondary | ICD-10-CM

## 2024-11-19 NOTE — Telephone Encounter (Signed)
 FYI Only or Action Required?: FYI only for provider: appointment scheduled on 11/21/24.  Patient was last seen in primary care on 10/06/2024 by Jolinda Norene HERO, DO.  Called Nurse Triage reporting Leg Swelling.  Symptoms began 5 weeks ago.  Interventions attempted: Prescription medications: Lasix  and Other: elevating legs.  Symptoms are: gradually worsening.  Triage Disposition: See Within 3 Days in Office (overriding See Physician Within 24 Hours)  Patient/caregiver understands and will follow disposition?: Yes              Copied from CRM [id number]. Topic: Clinical - Red Word Triage >> Nov 19, 2024 12:38 PM Harlene ORN wrote: Red Word that prompted transfer to Nurse Triage: extreme swelling in legs and feet for five weeks. Both legs are now oozing. Reason for Disposition  [1] MODERATE leg swelling (e.g., swelling extends up to knees) AND [2] new-onset or getting worse  Answer Assessment - Initial Assessment Questions Wife, on the phone for triage. Read PCP's noted: Ok to go up to twice daily for next few days but I want him seen in office Friday if possible. Please call and set up a visit. She states he only has 2 Lasix  pills left for today and tomorrow and will run out on Friday so they have not been doing twice daily. She states they can come in on Friday. KMS for the clinic shows the office will be closed on Friday. PCP's schedule in Book It shows availability for Friday, scheduled patient and advised wife to call back on Friday morning to confirm the clinic is open. Advised to go to ED for chest pain or SOB. Med refill request placed for patient.   1. ONSET: When did the swelling start? (e.g., minutes, hours, days)     5 weeks ago; worsening x 2-3 weeks.  2. LOCATION: What part of the leg is swollen?  Are both legs swollen or just one leg?     Bilateral; from feet to knees.  3. SEVERITY: How bad is the swelling? (e.g., localized; mild, moderate, severe)      Moderate; causing clear fluid to weep.  4. REDNESS: Is there redness or signs of infection?     There is some redness/blanching in lower legs.  5. PAIN: Is the swelling painful to touch? If Yes, ask: How painful is it?   (Scale 1-10; mild, moderate or severe)     No.  6. FEVER: Do you have a fever? If Yes, ask: What is it, how was it measured, and when did it start?      No.  7. CAUSE: What do you think is causing the leg swelling?     She states they were told that it is caused by medication which caused him to fall and break his back and that is related to his legs swelling.  8. MEDICAL HISTORY: Do you have a history of blood clots (e.g., DVT), cancer, heart failure, kidney disease, or liver failure?     No.  9. RECURRENT SYMPTOM: Have you had leg swelling before? If Yes, ask: When was the last time? What happened that time?     No.  10. OTHER SYMPTOMS: Do you have any other symptoms? (e.g., chest pain, difficulty breathing)       No chest pain or SOB.  Protocols used: Leg Swelling and Edema-A-AH

## 2024-11-21 ENCOUNTER — Encounter: Payer: Self-pay | Admitting: Family Medicine

## 2024-11-21 ENCOUNTER — Ambulatory Visit: Admitting: Family Medicine

## 2024-11-21 VITALS — BP 114/67 | HR 74 | Temp 97.5°F | Ht 69.0 in | Wt 178.1 lb

## 2024-11-21 DIAGNOSIS — R600 Localized edema: Secondary | ICD-10-CM | POA: Diagnosis not present

## 2024-11-21 MED ORDER — FUROSEMIDE 40 MG PO TABS: 40.0000 mg | ORAL_TABLET | Freq: Every day | ORAL | 0 refills | Status: AC | PRN

## 2024-11-21 MED ORDER — POTASSIUM CHLORIDE CRYS ER 20 MEQ PO TBCR: 20.0000 meq | EXTENDED_RELEASE_TABLET | Freq: Every day | ORAL | 0 refills | Status: AC | PRN
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
## 2024-11-21 NOTE — Patient Instructions (Signed)
 Lymphedema  Lymphedema is swelling that happens when an abnormal amount of lymph collects in the soft tissues under your skin. Lymph is fluid that moves through your lymphatic system. This system: Is part of your body's defense system, also called your immune system. Filters germs and waste from tissues in your body to your bloodstream. Lymphedema happens when your lymphatic system is blocked. This keeps lymph from draining as it should and leads to swelling. What are the causes? The cause of lymphedema depends on which type you have. Primary lymphedema is when you're born without lymph vessels or with lymph vessels that aren't normal. Secondary lymphedema is more common. It happens when lymph vessels are blocked or damaged from: Infection. Injury. Radiation therapy. Cancer. Scar tissue that forms. Surgery. What are the signs or symptoms? A swollen arm, leg, feet, toes, or fingers. A heavy or tight feeling in the swollen area. Skin that turns red near the swollen area. Not being able to move your arm or leg. Your arm or leg is sensitive to touch. Discomfort in your arm or leg. How is this diagnosed? Lymphedema may be diagnosed based on: Your symptoms and medical history. A physical exam. Bioimpedance spectroscopy. This test uses painless electrical currents. They help measure fluid levels in your body. Imaging tests, such as: MRI or CT scan. Duplex ultrasound. This test uses sound waves to make pictures on a screen. The pictures show your blood vessels and blood flow. Lymphoscintigraphy. In this test, a low dose of radioactive substance is given through a needle that goes through your skin. The substance traces the flow of lymph through your lymph vessels. Lymphangiography. In this test, a contrast dye is put into your lymph vessel. The dye helps show if the vessel is blocked. How is this treated?  If another condition is causing your lymphedema, that condition will be treated.  For example, antibiotics may be used to treat infection. Treatment for lymphedema depends on the cause. Treatment may include: Complete decongestive therapy (CDT). This lowers fluid buildup. CDT includes: Pressure (compression) wrapping of the area. Manual lymph drainage. This helps lymph drain out of your arm or leg. Certain exercises. These help fluid move out of your arm or leg. Compression. This puts pressure on your arm or leg to lower swelling. It includes: Compression stockings or sleeves. Special bandage wraps. Surgery. This is normally done only for severe cases that don't get better with other treatments. Follow these instructions at home: Self-care Your swollen area is more likely to get hurt or infected. To help prevent infection: Keep the area clean and dry. Use creams or lotions that your health care provider says are okay. These keep your skin moist. Protect your skin from cuts. Use gloves when you cook or garden. Do not walk barefoot. If you shave the area, use an Neurosurgeon. Do not wear tight clothes, shoes, or jewelry. Eat a healthy diet. Eat a lot of fruits and vegetables. Activity Do exercises as told by your provider. Do not sit with your legs crossed. When you can, keep the swollen leg or foot raised above the level of your heart. Avoid using an arm with lymphedema to carry things. General instructions Wear compression stockings or sleeves as told by your provider. Note any changes in size of the swollen arm or leg. You may be told to measure it at set times and track the results. Take over-the-counter and prescription medicines only as told by your provider. If you were prescribed antibiotics, use them  as told by your provider. Do not stop using the antibiotic even if you start to feel better or if your condition improves. Do not use heating pads or ice packs on the swollen area. Avoid having your swollen arm or leg used for: Blood draws. IVs. Blood  pressure checks. Contact a health care provider if: You get new swelling in your arm or leg all of a sudden. Fluid leaks from the skin of your swollen arm or leg. You have a cut that doesn't heal. The swollen area hurts or turns red. You get purple spots, a rash, blisters, or sores on your swollen arm or leg. You have a fever or chills. This information is not intended to replace advice given to you by your health care provider. Make sure you discuss any questions you have with your health care provider. Document Revised: 02/07/2023 Document Reviewed: 02/07/2023 Elsevier Patient Education  2024 [employer].

## 2024-11-22 LAB — BASIC METABOLIC PANEL WITH GFR
BUN/Creatinine Ratio: 18 (ref 10–24)
BUN: 26 mg/dL (ref 8–27)
CO2: 25 mmol/L (ref 20–29)
Calcium: 9.9 mg/dL (ref 8.6–10.2)
Chloride: 101 mmol/L (ref 96–106)
Creatinine, Ser: 1.41 mg/dL — ABNORMAL HIGH (ref 0.76–1.27)
Glucose: 93 mg/dL (ref 70–99)
Potassium: 4.2 mmol/L (ref 3.5–5.2)
Sodium: 138 mmol/L (ref 134–144)
eGFR: 51 mL/min/1.73 — ABNORMAL LOW

## 2024-11-22 LAB — BRAIN NATRIURETIC PEPTIDE: BNP: 45 pg/mL (ref 0.0–100.0)

## 2024-11-24 ENCOUNTER — Ambulatory Visit: Payer: Self-pay | Admitting: Family Medicine

## 2024-11-24 ENCOUNTER — Ambulatory Visit: Admitting: Family Medicine

## 2024-11-24 ENCOUNTER — Encounter: Payer: Self-pay | Admitting: Family Medicine

## 2024-11-24 VITALS — BP 135/74 | HR 78 | Temp 97.4°F | Ht 69.0 in | Wt 173.4 lb

## 2024-11-24 DIAGNOSIS — R600 Localized edema: Secondary | ICD-10-CM | POA: Diagnosis not present

## 2024-11-24 LAB — HEMOCHROMATOSIS DNA-PCR(C282Y,H63D)
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
## 2024-11-26 ENCOUNTER — Telehealth: Payer: Self-pay

## 2024-11-26 ENCOUNTER — Other Ambulatory Visit: Payer: Self-pay | Admitting: Family Medicine

## 2024-11-26 DIAGNOSIS — E782 Mixed hyperlipidemia: Secondary | ICD-10-CM

## 2024-11-26 NOTE — Telephone Encounter (Signed)
 Dr. Burnetta called and insisted on speaking to a physician. Dr. Skeet came out a room to speak to Dr. Burnetta because he refused to speak to Nurse Powell.  Dr. Burnetta wanted to know the reasoning the reasoning for the cervical MRI and no clearance.  Dr. Burnetta was advised by Dr. Skeet per Dr. 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  This is somewhat unusual but I did tell pt/wife/daughter that I got his clearance before he was even established as a patient.  I do not feel comfortable filling it out as he is clearly not optimized from a Parkinson's standpoint, and has obviously not been compliant in follow-ups with neurology.  That being said, that does not mean that I do not think that this is important in terms of treatment plans for him; I am just not sure that I am the most qualified person to be filling this out for him currently.   Dr. Collie office notes have been faxed to Dr. Burnetta. Dr. Burnetta has requested a call back from Dr. Evonnie upon her return.

## 2024-11-28 ENCOUNTER — Ambulatory Visit: Admitting: Family Medicine

## 2024-11-28 ENCOUNTER — Ambulatory Visit: Payer: Self-pay

## 2024-11-28 NOTE — Telephone Encounter (Signed)
 FYI Only or Action Required?: FYI only for provider: appointment scheduled on 12/01/24.  Patient was last seen in primary care on 11/24/2024 by Jolinda Norene HERO, DO.  Called Nurse Triage reporting Leg Swelling.  Symptoms began several weeks ago.  Interventions attempted: Prescription medications: Klor-con .  Symptoms are: gradually worsening.  Triage Disposition: See Physician Within 24 Hours  Patient/caregiver understands and will follow disposition?: Yes    Copied from CRM [id number]. Topic: Clinical - Red Word Triage >> Nov 28, 2024  4:56 PM Victoria B wrote: [facility] that prompted transfer to Nurse Triage: Patients legs are swollen   Reason for Disposition  [1] MODERATE leg swelling (e.g., swelling extends up to knees) AND [2] new-onset or getting worse  Answer Assessment - Initial Assessment Questions Pt's wife called in stating that pt was supposed to come in to clinic today for bilateral leg edema; pt has been seen weekly for eval and leg wraps. She states swelling was improved but as the evening has gone on, swelling seems to be coming back. Pt does have ointment and furosemide  pills but has not used either rx today. Denies any fever, no weeping, swelling bilaterally and no discomfort; they are swollen and hard. Pt rescheduled for 12/01/24 and educated on elevation and limiting salt intake. Appointment scheduled for evaluation. Patient agrees with plan of care, and will call back if anything changes, or if symptoms worsen.    1. ONSET: When did the swelling start? (e.g., minutes, hours, days)     Ongoing; pt has been seeing provider week for leg wraps. Pt cancelled appt today, 11/28/24 at 2pm as he felt swelling was better but now regrets cancelling appt.   2. LOCATION: What part of the leg is swollen?  Are both legs swollen or just one leg?     Bilateral legs to knees   3. SEVERITY: How bad is the swelling? (e.g., localized; mild, moderate, severe)      Moderate; denies any weeping. States swelling was looking better but that he feels it is starting to worsen since taking off wraps   4. REDNESS: Is there redness or signs of infection?     No   5. PAIN: Is the swelling painful to touch? If Yes, ask: How painful is it?   (Scale 1-10; mild, moderate or severe)     No   6. FEVER: Do you have a fever? If Yes, ask: What is it, how was it measured, and when did it start?      No  Protocols used: Leg Swelling and Edema-A-AH

## 2024-12-01 ENCOUNTER — Inpatient Hospital Stay: Attending: Oncology | Admitting: Oncology

## 2024-12-01 ENCOUNTER — Encounter: Payer: Self-pay | Admitting: Oncology

## 2024-12-01 ENCOUNTER — Encounter: Payer: Self-pay | Admitting: Nurse Practitioner

## 2024-12-01 ENCOUNTER — Ambulatory Visit (INDEPENDENT_AMBULATORY_CARE_PROVIDER_SITE_OTHER): Admitting: Nurse Practitioner

## 2024-12-01 VITALS — BP 115/70 | HR 83 | Temp 97.9°F | Ht 69.0 in | Wt 172.0 lb

## 2024-12-01 DIAGNOSIS — R7989 Other specified abnormal findings of blood chemistry: Secondary | ICD-10-CM | POA: Diagnosis not present

## 2024-12-01 DIAGNOSIS — R600 Localized edema: Secondary | ICD-10-CM

## 2024-12-01 DIAGNOSIS — R296 Repeated falls: Secondary | ICD-10-CM

## 2024-12-01 NOTE — Patient Instructions (Signed)
Unna Boot Care An Unna boot is a type of bandage (dressing) for the foot and leg. The dressing is a wrap made of gauze that is soaked with a medicine called zinc oxide. The gauze may also include other lotions and medicines that help in wound healing, such as calamine. An Unna boot may be used to: Treat open sores (venous ulcers) or graft sites on the foot, heel, or leg. Help with swelling from conditions that affect the veins or lymphatic system (lymphedema). Treat skin conditions such as inflammation caused by poor blood flow (stasis dermatitis). Heal wounds on parts of the body below the hips (lower extremities). The dressing is applied by a health care provider. The gauze is wrapped around your lower extremity in several layers that overlap. These layers usually start at the toes and go up to the knee. A dry outer wrap goes over the medicated wrap for support and pressure (compression). Before applying the Unna boot, your health care provider will clean your leg and foot and may apply an antibiotic. You may be asked to raise (elevate) your leg for a while to reduce swelling before the boot is put on. The boot will dry and harden after it is applied. It may need to be changed or replaced once or twice a week. Follow these instructions at home: Boot care Wear the Unna boot as told by your health care provider. You may need to wear a slipper or shoe over the boot that is one or two sizes larger than normal. Do not stick anything inside the boot to scratch your skin. Doing that increases your risk of infection. Check the skin around the boot every day. Tell your health care provider about any concerns. Keep your Unna boot clean and dry. Check the area around the boot every day for signs of infection. Check for: Redness, swelling, or pain in your foot or toes. Fluid or blood coming from the boot. Warmth. Pus or a bad smell. A rash, itching, or red, swollen areas of skin (hives). Remove the boot  and call your health care provider if you have signs of poor blood flow, such as: Your toes tingle or become numb. Your toes turn cold or turn blue or pale. Your toes are more swollen or painful. You cannot move your toes. Bathing Do not take baths, swim, or use a hot tub until your health care provider approves. Ask your health care provider if you may take showers. You may only be allowed to take sponge baths. If your health care provider says that you can take a bath or shower: Do not let the Unna boot get wet. Cover the boot with a watertight covering when you shower. Keep your leg with the boot out of the bathtub when you take a bath. Activity Rest as told by your health care provider. Do not sit for a long time without moving. Get up to take short walks every 1-2 hours. This will improve blood flow and breathing. Ask for help if you feel weak or unsteady. You may walk with the boot once it has dried. Ask your health care provider how much walking is safe for you. General instructions Take over-the-counter and prescription medicines only as told by your health care provider. Keep your leg elevated above the level of your heart while you are sitting or lying down. This will decrease swelling. Do not sit with your knee bent for long periods of time. Do not use any products that contain nicotine   or tobacco. These products include cigarettes, chewing tobacco, and vaping devices, such as e-cigarettes. If you need help quitting, ask your health care provider. Keep all follow-up visits. Your health care provider will change your boot once or twice a week until it is no longer needed. Contact a health care provider if: Your skin feels itchy inside the boot. You feel burning or have a rash or hives in the boot area. You have a fever or chills. You have any signs of infection. You have more numbness or pain in your foot or toes. The skin on your foot or toes changes colors. This may include the  skin turning blue or pale or having patchy areas with spots. Your boot has been damaged or feels like it no longer fits like it should. This information is not intended to replace advice given to you by your health care provider. Make sure you discuss any questions you have with your health care provider. Document Revised: 04/10/2022 Document Reviewed: 04/10/2022 [employer] Patient Education  2024 [employer]  

## 2024-12-01 NOTE — Progress Notes (Signed)
 "  [facility]  HEMATOLOGY-ONCOLOGY ELECTRONIC VISIT PROGRESS NOTE  PATIENT NAME: Randy [name]   MR#: [id number] DOB: [birth year]  DATE OF SERVICE: 12/01/2024  Patient Care Team: Jolinda Norene HERO, DO as PCP - General (Family Medicine) Court Dorn PARAS, MD as PCP - Cardiology (Cardiology)  I connected with the patient via telephone conference and verified that I am speaking with the correct person using two identifiers. The patient's location is at home and I am providing care from the [facility].  I discussed the limitations, risks, security and privacy concerns of performing an evaluation and management service by e-visits and the availability of in person appointments. I also discussed with the patient that there may be a patient responsible charge related to this service. The patient expressed understanding and agreed to proceed.   ASSESSMENT & PLAN:   Randy [name] is a 79 y.o. gentleman with a past medical history of Parkinson's disease, psoriasis, BPH, dyslipidemia, hypertension, GERD, was referred to our service for evaluation of elevated ferritin.  Heterozygous for H63D mutation.  No evidence of C282Y mutation or S65C mutation.  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  Elevated ferritin level Ferritin remains persistently elevated (peak 914 ng/mL in October 2024, recent 690 ng/mL) with normal iron saturation.    No clinical evidence of iron overload or organ dysfunction attributable to iron deposition.    On his consultation with us  on 11/17/2024, labs showed hemoglobin of 12.8, grossly unremarkable CBCD otherwise.  CMP unremarkable.  Ferritin 642.  Iron saturation normal at 26%.  CRP, sed rate and LDH were all normal.   HFE gene mutation analysis showed no evidence of C282Y or S65C mutations.  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  Less likely to cause hereditary hemochromatosis.  No consensus on management of heterozygosity of H63D mutation alone.  At the  minimum surveillance is recommended.   Chronic conditions, including psoriasis and Parkinson's disease, may contribute to the mild hyperferritinemia. - Initiate phlebotomy if ferritin increases above 1000.  - Scheduled follow-up in four months with repeat labs.     I discussed the assessment and treatment plan with the patient. The patient was provided an opportunity to ask questions and all were answered. The patient agreed with the plan and demonstrated an understanding of the instructions. The patient was advised to call back or seek an in-person evaluation if the symptoms worsen or if the condition fails to improve as anticipated.    I spent 15 minutes over the phone with the patient reviewing test results, discuss management and coordination/planning of care.  Chinita Patten, MD 12/01/2024 5:18 PM [facility] - A DEPT OF [facility] [address] [place] [address] Dept: [phone] Dept Fax: [phone]   INTERVAL HISTORY:  Please see above for problem oriented charting.  The purpose of today's discussion is to explain recent lab results and to formulate plan of care.  Discussed the use of AI scribe software for clinical note transcription with the patient, who gave verbal consent to proceed.  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  He has persistently elevated ferritin, with the most recent value at 642 ng/mL, improved from prior measurements of 692 ng/mL and 914 ng/mL over the past year. Other iron studies are within normal limits, and inflammatory markers are not elevated.  Genetic testing revealed absence of the C282Y mutation and presence of a single H63D mutation, consistent with a carrier state.  Psoriasis and Parkinson's disease are  present.    SUMMARY OF HEMATOLOGY HISTORY:  Elevated ferritin was first noted in October 2024 at 914 ng/mL, decreasing to 690 ng/mL by October 2025. Iron saturation has remained normal at 20%. He has not undergone phlebotomy, iron-reducing therapies, or genetic testing for hemochromatosis. Appetite is good, weight is stable at 179 lbs, and there has been no unintentional weight loss.   Chronic lower extremity edema has been present since September 2025, following a fall. Swelling in the feet and legs persists, requiring changes in footwear. He reports occasional mild discomfort and cramps in the lower legs, particularly with elevation. He uses compression socks and wraps with assistance and takes daily furosemide . He has not reported a history of heart failure, and family reports that a recent emergency room cardiac evaluation was normal. Protein levels have been normal. Mobility is limited, and he requires a wheelchair for ambulation.   Psoriasis is chronic, primarily affecting the legs and elbows, with prior involvement of the hands. Skin is very dry. He is not under dermatologic or rheumatologic care and is not using prescribed topical treatments. Over-the-counter topical agents are available at home, but he is reluctant to use them.  Ferritin remains persistently elevated (peak 914 ng/mL in October 2024, recent 690 ng/mL) with normal iron saturation.    No clinical evidence of iron overload or organ dysfunction attributable to iron deposition.    On his consultation with us  on 11/17/2024, labs showed hemoglobin of 12.8, grossly unremarkable CBCD otherwise.  CMP unremarkable.  Ferritin 642.  Iron saturation normal at 26%.  CRP, sed rate and LDH were all normal.   HFE gene mutation analysis showed no evidence of C282Y or S65C mutations.  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  Less likely to cause hereditary hemochromatosis.  No consensus on management of heterozygosity of H63D mutation  alone.  At the minimum surveillance is recommended.  Chronic conditions, including psoriasis and Parkinson's disease, may contribute to the mild hyperferritinemia. - Initiate phlebotomy if ferritin increases above 1000.   REVIEW OF SYSTEMS:    Review of Systems - Oncology  All other pertinent systems were reviewed with the patient and are negative.  I have reviewed the past medical history, past surgical history, social history and family history with the patient and they are unchanged from previous note.  ALLERGIES:  He is allergic to septra  [sulfamethoxazole -trimethoprim ].  MEDICATIONS:  Current Outpatient Medications  Medication Sig Dispense Refill   carbidopa -levodopa  (SINEMET  IR) 25-100 MG tablet Take 1 tablet by mouth 3 (three) times daily. 8am/noon/4pm 270 tablet 1   fenofibrate  (TRICOR ) 145 MG tablet TAKE 1 TABLET EVERY DAY 90 tablet 3   furosemide  (LASIX ) 40 MG tablet Take 1 tablet (40 mg total) by mouth daily as needed (leg swelling. take with potassium). 30 tablet 0   ibuprofen  (ADVIL ) 600 MG tablet Take 1 tablet (600 mg total) by mouth every 8 (eight) hours as needed. 30 tablet 0   metoprolol  succinate (TOPROL -XL) 50 MG 24 hr tablet TAKE 1 TABLET DAILY. TAKE WITH OR IMMEDIATELY FOLLOWING A MEAL. (TO REPLACE METOPROLOL  TARTRATE) 90 tablet 3   omeprazole  (PRILOSEC) 20 MG capsule Take 1 capsule (20 mg total) by mouth daily. 90 capsule 3   potassium chloride  SA (KLOR-CON  M) 20 MEQ tablet Take 1 tablet (20 mEq total) by mouth daily as needed (need for  furosemide / fluid pill). 30 tablet 0   tamsulosin  (FLOMAX ) 0.4 MG CAPS capsule Take 1 capsule (0.4 mg total) by mouth at bedtime. 90 capsule 3   No current facility-administered medications for this visit.    PHYSICAL EXAMINATION:  Not performed today as it was a phone only visit  LABORATORY DATA:   I have reviewed the data as listed.  Recent Results (from the past 2160 hours)  Basic Metabolic Panel     Status: Abnormal    Collection Time: 10/06/24  3:52 PM  Result Value Ref Range   Glucose 83 70 - 99 mg/dL   BUN 34 (H) 8 - 27 mg/dL   Creatinine, Ser 8.75 0.76 - 1.27 mg/dL   eGFR 60 >40 fO/fpw/8.26   BUN/Creatinine Ratio 27 (H) 10 - 24   Sodium 135 134 - 144 mmol/L   Potassium 4.5 3.5 - 5.2 mmol/L   Chloride 97 96 - 106 mmol/L   CO2 23 20 - 29 mmol/L   Calcium 10.2 8.6 - 10.2 mg/dL  Basic metabolic panel     Status: Abnormal   Collection Time: 10/20/24 12:56 PM  Result Value Ref Range   Sodium 141 135 - 145 mmol/L   Potassium 3.8 3.5 - 5.1 mmol/L   Chloride 104 98 - 111 mmol/L   CO2 26 22 - 32 mmol/L   Glucose, Bld 86 70 - 99 mg/dL    Comment: Glucose reference range applies only to samples taken after fasting for at least 8 hours.   BUN 26 (H) 8 - 23 mg/dL   Creatinine, Ser 8.48 (H) 0.61 - 1.24 mg/dL   Calcium 9.7 8.9 - 89.6 mg/dL   GFR, Estimated 47 (L) >60 mL/min    Comment: (NOTE) Calculated using the CKD-EPI Creatinine Equation (2021)    Anion gap 11 5 - 15    Comment: Performed at [facility], [address] [place], [place] [address]  CBC with Differential/Platelet     Status: None   Collection Time: 10/20/24 12:56 PM  Result Value Ref Range   WBC 4.9 4.0 - 10.5 K/uL   RBC 4.27 4.22 - 5.81 MIL/uL   Hemoglobin 13.1 13.0 - 17.0 g/dL   HCT 59.9 60.9 - 47.9 %   MCV 93.7 80.0 - 100.0 fL   MCH 30.7 26.0 - 34.0 pg   MCHC 32.8 30.0 - 36.0 g/dL   RDW 86.8 88.4 - 84.4 %   Platelets 205 150 - 400 K/uL   nRBC 0.0 0.0 - 0.2 %   Neutrophils Relative % 70 %   Neutro Abs 3.4 1.7 - 7.7 K/uL   Lymphocytes Relative 19 %   Lymphs Abs 0.9 0.7 - 4.0 K/uL   Monocytes Relative 8 %   Monocytes Absolute 0.4 0.1 - 1.0 K/uL   Eosinophils Relative 3 %   Eosinophils Absolute 0.1 0.0 - 0.5 K/uL   Basophils Relative 0 %   Basophils Absolute 0.0 0.0 - 0.1 K/uL   Immature Granulocytes 0 %   Abs Immature Granulocytes 0.01 0.00 - 0.07 K/uL    Comment: Performed at [facility], [address], [place], [place] [address]  Urinalysis, Routine w reflex microscopic -Urine, Clean Catch     Status: Abnormal   Collection Time: 10/20/24 12:56 PM  Result Value Ref Range   Color, Urine STRAW (A) YELLOW   APPearance HAZY (A) CLEAR   Specific Gravity, Urine 1.015 1.005 - 1.030   pH 7.0 5.0 - 8.0  Glucose, UA NEGATIVE NEGATIVE mg/dL   Hgb urine dipstick TRACE (A) NEGATIVE   Bilirubin Urine NEGATIVE NEGATIVE   Ketones, ur NEGATIVE NEGATIVE mg/dL   Protein, ur NEGATIVE NEGATIVE mg/dL   Nitrite NEGATIVE NEGATIVE   Leukocytes,Ua LARGE (A) NEGATIVE    Comment: Performed at [facility], [address], [place], [place] [address]  Urinalysis, Microscopic (reflex)     Status: Abnormal   Collection Time: 10/20/24 12:56 PM  Result Value Ref Range   RBC / HPF 0-5 0 - 5 RBC/hpf   WBC, UA 0-5 0 - 5 WBC/hpf   Bacteria, UA MANY (A) NONE SEEN   Squamous Epithelial / HPF 0-5 0 - 5 /HPF    Comment: Performed at [facility], [address], [place], [place] [address]  Urine Culture     Status: Abnormal   Collection Time: 10/20/24  4:46 PM   Specimen: Urine, Clean Catch  Result Value Ref Range   Specimen Description URINE, CLEAN CATCH    Special Requests      NONE Performed at [facility], [address], [place], [place] [address]    Culture >=100,000 COLONIES/mL ESCHERICHIA COLI (A)    Report Status 10/22/2024 FINAL    Organism ID, Bacteria ESCHERICHIA COLI (A)       Susceptibility   Escherichia coli - MIC*    AMPICILLIN <=2 SENSITIVE Sensitive     CEFAZOLIN  (URINE) Value in next row Sensitive      <=1 SENSITIVEThis is a modified FDA-approved test that has been validated and its performance characteristics determined by the reporting laboratory.  This laboratory is certified under the Clinical Laboratory Improvement Amendments CLIA as qualified to perform high complexity clinical laboratory testing.    CEFEPIME Value in next row Sensitive      <=1 SENSITIVEThis is a  modified FDA-approved test that has been validated and its performance characteristics determined by the reporting laboratory.  This laboratory is certified under the Clinical Laboratory Improvement Amendments CLIA as qualified to perform high complexity clinical laboratory testing.    ERTAPENEM Value in next row Sensitive      <=1 SENSITIVEThis is a modified FDA-approved test that has been validated and its performance characteristics determined by the reporting laboratory.  This laboratory is certified under the Clinical Laboratory Improvement Amendments CLIA as qualified to perform high complexity clinical laboratory testing.    CEFTRIAXONE  Value in next row Sensitive      <=1 SENSITIVEThis is a modified FDA-approved test that has been validated and its performance characteristics determined by the reporting laboratory.  This laboratory is certified under the Clinical Laboratory Improvement Amendments CLIA as qualified to perform high complexity clinical laboratory testing.    CIPROFLOXACIN Value in next row Sensitive      <=1 SENSITIVEThis is a modified FDA-approved test that has been validated and its performance characteristics determined by the reporting laboratory.  This laboratory is certified under the Clinical Laboratory Improvement Amendments CLIA as qualified to perform high complexity clinical laboratory testing.    GENTAMICIN Value in next row Sensitive      <=1 SENSITIVEThis is a modified FDA-approved test that has been validated and its performance characteristics determined by the reporting laboratory.  This laboratory is certified under the Clinical Laboratory Improvement Amendments CLIA as qualified to perform high complexity clinical laboratory testing.    NITROFURANTOIN Value in next row Sensitive      <=1 SENSITIVEThis is a modified FDA-approved test that has been validated  and its performance characteristics determined by the reporting laboratory.  This laboratory is certified under  the Clinical Laboratory Improvement Amendments CLIA as qualified to perform high complexity clinical laboratory testing.    TRIMETH /SULFA  Value in next row Sensitive      <=1 SENSITIVEThis is a modified FDA-approved test that has been validated and its performance characteristics determined by the reporting laboratory.  This laboratory is certified under the Clinical Laboratory Improvement Amendments CLIA as qualified to perform high complexity clinical laboratory testing.    AMPICILLIN/SULBACTAM Value in next row Sensitive      <=1 SENSITIVEThis is a modified FDA-approved test that has been validated and its performance characteristics determined by the reporting laboratory.  This laboratory is certified under the Clinical Laboratory Improvement Amendments CLIA as qualified to perform high complexity clinical laboratory testing.    PIP/TAZO Value in next row Sensitive      <=4 SENSITIVEThis is a modified FDA-approved test that has been validated and its performance characteristics determined by the reporting laboratory.  This laboratory is certified under the Clinical Laboratory Improvement Amendments CLIA as qualified to perform high complexity clinical laboratory testing.    MEROPENEM Value in next row Sensitive      <=4 SENSITIVEThis is a modified FDA-approved test that has been validated and its performance characteristics determined by the reporting laboratory.  This laboratory is certified under the Clinical Laboratory Improvement Amendments CLIA as qualified to perform high complexity clinical laboratory testing.    * >=100,000 COLONIES/mL ESCHERICHIA COLI  Hemochromatosis DNA, PCR     Status: None   Collection Time: 11/17/24 11:20 AM  Result Value Ref Range   DNA Mutation Analysis Comment     Comment: (NOTE) Results: c.845G>A (p.Cys282Tyr) - Not Detected c.187C>G (p.His63Asp) - Detected, heterozygous c.193A>T (p.Ser65Cys) - Not Detected Not associated with increased risk to develop  clinical symptoms of Hereditary Hemochromatosis. In symptomatic individuals, other causes of iron overload should be evaluated. See Additional Information and Comments. Additional Clinical Information: Hereditary hemochromatosis (HFE related) is an autosomal recessive iron storage disorder. Patients may have a genetic diagnosis of hereditary hemochromatosis and never show clinical symptoms. Clinical symptoms typically appear between 40 to 60 years in males and after menopause in females. Signs and symptoms may include organ damage, primarily in the liver, risk for hepatocellular carcinoma, diabetes, and heart disease due to iron accumulation. Life expectancy may be decreased in individuals who develop cirrhosis. Treatment for clinically symptomatic individuals may include therapeutic ph lebotomy. Liver transplant may be used to treat end stage liver failure. For preventive care, monitoring for iron overload is recommended for patients who are homozygous for c.845G>A (p.Cys282Tyr) and have yet to experience clinical symptoms. Comments: The most common HFE variants associated with hereditary hemochromatosis are c.845G>A (p.Cys282Tyr), c.187C>G (p.His63Asp), c.193A>T (p.Ser65Cys). While patients homozygous for c.845G>A (p.Cys282Tyr) are the most likely to present clinical symptoms, less than 10% develop clinically significant iron overload with tissue and organ damage. Genetic counseling is recommended to discuss the potential clinical implications of positive results, as well as recommendations for testing family members. Genetic Coordinators are available for health care providers to discuss results at [phone]). Test Details: Three variants analyzed: c.845G>A (p.Cys282Tyr), commonly referred to as C282Y c.187C>G (p.His63Asp), commonly re ferred to as H63D c.193A>T (p.Ser65Cys), commonly referred to as S65C Methods/Limitations: DNA Analysis of the HFE gene (NM_[id number])  was performed by PCR amplification followed by restriction enzyme digestion analyses. Results must be combined with clinical information for the most accurate interpretation. Molecular-based testing  is highly accurate, but as in any laboratory test, diagnostic errors may occur. False positive or false negative results may occur for reasons that include genetic variants, blood transfusions, bone marrow transplantation, somatic or tissue-specific mosaicism, mislabeled samples, or erroneous representation of family relationships. This test was developed and its performance characteristics determined by [facility]. It has not been cleared or approved by the Food and Drug Administration. References: Aldona CAVES, [address], Kowdley SONNA Monte LW, [place] AS; American Association for the Study of Liver Diseases. Diagnosis and management of hemochromat osis: 2011 practice guideline by the American Association for the Study of Liver Diseases. Hepatology. 2011 Jul;54(1):328-43. doi: 10.1002/hep.24330. PMID: 78547709; PMCID: EFR6850874. [address] Brissot P, Swinkels DW, Zoller H, Kamarainen O, Patton S, Alonso I, Morris M, Keeney S. EMQN best practice guidelines for the molecular genetic diagnosis of hereditary hemochromatosis [facility]). Eur J Hum Genet. 2016 Apr;24(4):479-95. doi: 10.1038/ejhg.2015.128. Epub 2015 Jul 8. PMID: 73846781; PMCID: EFR5070138.    Reviewed by: Comment     Comment: (NOTE) Technical Component performed at [facility] Professional Component performed by: Toni R. Prezant, PhD, [facility] TPTGD5, [facility], [address] [place] [address] Performed At: [facility] [address] [place], [place] [id number] Loran Gales MDPhD Ey:[phone]   Lactate dehydrogenase     Status: None   Collection Time: 11/17/24 11:20 AM  Result Value Ref Range   LDH 189 105 - 235 U/L    Comment: Performed at [facility], [address], [place], [place] [address]  C-reactive  protein     Status: None   Collection Time: 11/17/24 11:20 AM  Result Value Ref Range   CRP <0.5 <1.0 mg/dL    Comment: CORRECTED RESULTS CALLED TO: A.CRUICKSHANK,MT 1315 11/18/24 CLARK,S Performed at [facility], [address] [place], [place] [address] CORRECTED ON 12/23 AT 1315: PREVIOUSLY REPORTED AS <3.0   Sedimentation rate     Status: None   Collection Time: 11/17/24 11:20 AM  Result Value Ref Range   Sed Rate 12 0 - 16 mm/hr    Comment: Performed at [facility], [address] [place], [place] [address]  Ferritin     Status: Abnormal   Collection Time: 11/17/24 11:20 AM  Result Value Ref Range   Ferritin 642 (H) 24 - 336 ng/mL    Comment: Performed at [facility], [address] [place], [place] [address]  Iron and TIBC     Status: None   Collection Time: 11/17/24 11:20 AM  Result Value Ref Range   Iron 87 45 - 182 ug/dL   TIBC 663 749 - 549 ug/dL   Saturation Ratios 26 17.9 - 39.5 %   UIBC 249 ug/dL    Comment: Performed at [facility], [address] [place], [place] [address]  CMP (Cancer Center only)     Status: None   Collection Time: 11/17/24 11:20 AM  Result Value Ref Range   Sodium 139 135 - 145 mmol/L   Potassium 4.4 3.5 - 5.1 mmol/L   Chloride 103 98 - 111 mmol/L   CO2 29 22 - 32 mmol/L   Glucose, Bld 89 70 - 99 mg/dL    Comment: Glucose reference range applies only to samples taken after fasting for at least 8 hours.   BUN 22 8 - 23 mg/dL   Creatinine 8.92 9.38 - 1.24 mg/dL   Calcium 89.6 8.9 - 89.6 mg/dL   Total Protein 7.1 6.5 - 8.1 g/dL  Albumin  4.2 3.5 - 5.0 g/dL   AST 38 15 - 41 U/L   ALT 7 0 - 44 U/L   Alkaline Phosphatase 90 38 - 126 U/L   Total Bilirubin 0.5 0.0 - 1.2 mg/dL   GFR, Estimated >39 >39 mL/min    Comment: (NOTE) Calculated using the CKD-EPI Creatinine Equation (2021)    Anion gap 8 5 - 15    Comment: Performed at [facility], [address]  [place], [place] [address]  CBC with Differential (Cancer Center Only)     Status: Abnormal   Collection Time: 11/17/24 11:20 AM  Result Value Ref Range   WBC Count 5.2 4.0 - 10.5 K/uL   RBC 4.05 (L) 4.22 - 5.81 MIL/uL   Hemoglobin 12.8 (L) 13.0 - 17.0 g/dL   HCT 61.6 (L) 60.9 - 47.9 %   MCV 94.6 80.0 - 100.0 fL   MCH 31.6 26.0 - 34.0 pg   MCHC 33.4 30.0 - 36.0 g/dL   RDW 86.1 88.4 - 84.4 %   Platelet Count 187 150 - 400 K/uL   nRBC 0.0 0.0 - 0.2 %   Neutrophils Relative % 68 %   Neutro Abs 3.5 1.7 - 7.7 K/uL   Lymphocytes Relative 21 %   Lymphs Abs 1.1 0.7 - 4.0 K/uL   Monocytes Relative 7 %   Monocytes Absolute 0.4 0.1 - 1.0 K/uL   Eosinophils Relative 4 %   Eosinophils Absolute 0.2 0.0 - 0.5 K/uL   Basophils Relative 0 %   Basophils Absolute 0.0 0.0 - 0.1 K/uL   Immature Granulocytes 0 %   Abs Immature Granulocytes 0.02 0.00 - 0.07 K/uL    Comment: Performed at [facility], [address], [place], [place] [address]  Basic Metabolic Panel     Status: Abnormal   Collection Time: 11/21/24  3:55 PM  Result Value Ref Range   Glucose 93 70 - 99 mg/dL   BUN 26 8 - 27 mg/dL   Creatinine, Ser 8.58 (H) 0.76 - 1.27 mg/dL   eGFR 51 (L) >40 fO/fpw/8.26   BUN/Creatinine Ratio 18 10 - 24   Sodium 138 134 - 144 mmol/L   Potassium 4.2 3.5 - 5.2 mmol/L   Chloride 101 96 - 106 mmol/L   CO2 25 20 - 29 mmol/L   Calcium 9.9 8.6 - 10.2 mg/dL  Brain natriuretic peptide     Status: None   Collection Time: 11/21/24  3:55 PM  Result Value Ref Range   BNP 45.0 0.0 - 100.0 pg/mL    Comment: Siemens ADVIA Centaur XP methodology     RADIOGRAPHIC STUDIES:  I have personally reviewed the radiological images as listed and agree with the findings in the report.  MR CERVICAL SPINE WO CONTRAST Result Date: 11/06/2024 CLINICAL DATA:  Acute onset neck pain.  No known injury. EXAM: MRI CERVICAL SPINE WITHOUT CONTRAST TECHNIQUE: Multiplanar, multisequence MR imaging of the cervical  spine was performed. No intravenous contrast was administered. COMPARISON:  Plain film cervical spine 10/20/2024. FINDINGS: Alignment: There is trace retrolisthesis C2 on C3 and C3 on C4. Vertebrae: No fracture, evidence of discitis, or bone lesion. Cord: Normal signal throughout. Posterior Fossa, vertebral arteries, paraspinal tissues: Negative. Disc levels: C2-3: Shallow disc bulge.  No stenosis. C3-4: There is a shallow disc bulge and mild-to-moderate facet arthropathy, worse on the left. The central canal and right foramen are open. Mild to moderate left foraminal narrowing is present. C4-5: There is left worse than  right facet arthropathy with mild subchondral edema in the left superior facet. A disc osteophyte complex with uncovertebral spurring and ligamentum flavum thickening cause moderate central canal stenosis with flattening of the ventral cord. Moderate to moderately severe foraminal narrowing is worse on the left. C5-6: Disc osteophyte complex is eccentric to the left there is bilateral uncovertebral spurring. Ligamentum flavum thickening is seen. There is moderately severe to severe central canal stenosis with marked flattening of the cord. Severe bilateral foraminal narrowing is worse on the left. C6-7: There is a shallow broad-based central protrusion and ligamentum flavum thickening. Mild central canal stenosis and left foraminal narrowing. The right foramen is open. C7-T1: Shallow central protrusion and mild facet arthritis. No stenosis. IMPRESSION: 1. 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 2. Moderate central canal stenosis at C4-5 with flattening of the ventral cord. Moderate to moderately severe foraminal narrowing at C4-5 is worse on the left. 3. Mild to moderate left foraminal narrowing C3-4. 4. Mild central canal and left  foraminal narrowing C6-7. Electronically Signed   By: Debby Prader M.D.   On: 11/06/2024 09:16    No orders of the defined types were placed in this encounter.    Future Appointments  Date Time Provider Department Center  12/05/2024  2:15 PM Gladis Mustard, FNP [facility] [address]  01/07/2025  1:30 PM Court Dorn PARAS, MD [facility]  03/02/2025  1:00 PM Tat, Asberry RAMAN, DO [facility] None  03/16/2025  2:00 PM [facility] PHLEBOTOMIST [facility] None  03/16/2025  2:30 PM Amandeep Hogston, Chinita, MD [facility] None    This document was completed utilizing speech recognition software. Grammatical errors, random word insertions, pronoun errors, and incomplete sentences are an occasional consequence of this system due to software limitations, ambient noise, and hardware issues. Any formal questions or concerns about the content, text or information contained within the body of this dictation should be directly addressed to the provider for clarification.  "

## 2024-12-01 NOTE — Assessment & Plan Note (Addendum)
 Ferritin remains persistently elevated (peak 914 ng/mL in October 2024, recent 690 ng/mL) with normal iron saturation.    No clinical evidence of iron overload or organ dysfunction attributable to iron deposition.    On his consultation with us  on 11/17/2024, labs showed hemoglobin of 12.8, grossly unremarkable CBCD otherwise.  CMP unremarkable.  Ferritin 642.  Iron saturation normal at 26%.  CRP, sed rate and LDH were all normal.   HFE gene mutation analysis showed no evidence of C282Y or S65C mutations.  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  Less likely to cause hereditary hemochromatosis.  No consensus on management of heterozygosity of H63D mutation alone.  At the minimum surveillance is recommended.   Chronic conditions, including psoriasis and Parkinson's disease, may contribute to the mild hyperferritinemia. - Initiate phlebotomy if ferritin increases above 1000.  - Scheduled follow-up in four months with repeat labs.

## 2024-12-01 NOTE — Progress Notes (Signed)
" ° °  Subjective:    Patient ID: Randy [name], male    DOB: 1946/08/03, 79 y.o.   MRN: [id number]   Chief Complaint: peripheral edema  HPI  Patient brought in by daughter. Patient is c/o bil calf swelling. Started a few weeks ago. Unna boot was applied. Unna boots removed last week. Wants to check and make sure do not need to be wrapped again.   Has had frequent falls- daughter wants ammonia levels checked- would really like to try lactulose but need to see labs first. Patient Active Problem List   Diagnosis Date Noted   Elevated ferritin level 11/17/2024   Benign prostatic hyperplasia with nocturia 10/06/2024   Frequent falls 10/06/2024   Bilateral leg edema 10/06/2024   Parkinson's disease (HCC) 02/14/2018   Psoriasis 12/18/2016   Essential hypertension 03/02/2014   Hyperlipidemia 03/02/2014   Family history of heart disease 03/02/2014       Review of Systems  Constitutional:  Negative for diaphoresis.  Eyes:  Negative for pain.  Respiratory:  Negative for shortness of breath.   Cardiovascular:  Positive for leg swelling. Negative for chest pain and palpitations.  Gastrointestinal:  Negative for abdominal pain.  Endocrine: Negative for polydipsia.  Skin:  Negative for rash.  Neurological:  Negative for dizziness, weakness and headaches.  Hematological:  Does not bruise/bleed easily.  All other systems reviewed and are negative.      Objective:   Physical Exam Constitutional:      Appearance: Normal appearance.  Cardiovascular:     Rate and Rhythm: Normal rate and regular rhythm.     Heart sounds: Normal heart sounds.  Pulmonary:     Effort: Pulmonary effort is normal.     Breath sounds: Normal breath sounds.  Musculoskeletal:     Right lower leg: Edema (3+- measure 16 3/4 in) present.     Left lower leg: Edema (3+- measure 16 1/2 in) present.  Skin:    General: Skin is warm.  Neurological:     General: No focal deficit present.     Mental Status: He is  alert and oriented to person, place, and time.  Psychiatric:        Mood and Affect: Mood normal.        Behavior: Behavior normal.    BP 115/70   Pulse 83   Temp 97.9 F (36.6 C) (Temporal)   Ht 5' 9 (1.753 m)   Wt 172 lb (78 kg)   SpO2 95%   BMI 25.40 kg/m         Assessment & Plan:  Randy [name] in today with chief complaint of bilateral legs swelling   1. Peripheral edema (Primary) Unna boots applied - Apply unna boot  2. Frequent falls Labs pending - Ammonia - [id number]    The above assessment and management plan was discussed with the patient. The patient verbalized understanding of and has agreed to the management plan. Patient is aware to call the clinic if symptoms persist or worsen. Patient is aware when to return to the clinic for a follow-up visit. Patient educated on when it is appropriate to go to the emergency department.   Mary-Margaret Gladis, FNP    "

## 2024-12-02 ENCOUNTER — Ambulatory Visit: Payer: Self-pay | Admitting: Nurse Practitioner

## 2024-12-02 LAB — CMP14+EGFR
ALT: 6 IU/L (ref 0–44)
AST: 24 IU/L (ref 0–40)
Albumin: 4.1 g/dL (ref 3.8–4.8)
Alkaline Phosphatase: 87 IU/L (ref 47–123)
BUN/Creatinine Ratio: 21 (ref 10–24)
BUN: 28 mg/dL — ABNORMAL HIGH (ref 8–27)
Bilirubin Total: 0.5 mg/dL (ref 0.0–1.2)
CO2: 26 mmol/L (ref 20–29)
Calcium: 10 mg/dL (ref 8.6–10.2)
Chloride: 102 mmol/L (ref 96–106)
Creatinine, Ser: 1.36 mg/dL — ABNORMAL HIGH (ref 0.76–1.27)
Globulin, Total: 2.4 g/dL (ref 1.5–4.5)
Glucose: 79 mg/dL (ref 70–99)
Potassium: 4.1 mmol/L (ref 3.5–5.2)
Sodium: 142 mmol/L (ref 134–144)
Total Protein: 6.5 g/dL (ref 6.0–8.5)
eGFR: 53 mL/min/1.73 — ABNORMAL LOW

## 2024-12-02 LAB — AMMONIA: Ammonia: 34 ug/dL (ref 31–169)

## 2024-12-05 ENCOUNTER — Ambulatory Visit: Admitting: Nurse Practitioner

## 2024-12-05 ENCOUNTER — Encounter: Payer: Self-pay | Admitting: Nurse Practitioner

## 2024-12-05 VITALS — BP 115/61 | HR 77 | Temp 97.7°F | Ht 69.0 in | Wt 175.0 lb

## 2024-12-05 DIAGNOSIS — R600 Localized edema: Secondary | ICD-10-CM

## 2024-12-05 NOTE — Patient Instructions (Signed)
 Peripheral Edema  Peripheral edema is swelling that is caused by a buildup of fluid. Peripheral edema most often affects the lower legs, ankles, and feet. It can also develop in the arms, hands, and face. The area of the body that has peripheral edema will look swollen. It may also feel heavy or warm. Your clothes may start to feel tight. Pressing on the area may make a temporary dent in your skin (pitting edema). You may not be able to move your swollen arm or leg as much as usual. There are many causes of peripheral edema. It can happen because of a complication of other conditions such as heart failure, kidney disease, or a problem with your circulation. It also can be a side effect of certain medicines or happen because of an infection. It often happens to women during pregnancy. Sometimes, the cause is not known. Follow these instructions at home: Managing pain, stiffness, and swelling  Raise (elevate) your legs while you are sitting or lying down. Move around often to prevent stiffness and to reduce swelling. Do not sit or stand for long periods of time. Do not wear tight clothing. Do not wear garters on your upper legs. Exercise your legs to get your circulation going. This helps to move the fluid back into your blood vessels, and it may help the swelling go down. Wear compression stockings as told by your health care provider. These stockings help to prevent blood clots and reduce swelling in your legs. It is important that these are the correct size. These stockings should be prescribed by your doctor to prevent possible injuries. If elastic bandages or wraps are recommended, use them as told by your health care provider. Medicines Take over-the-counter and prescription medicines only as told by your health care provider. Your health care provider may prescribe medicine to help your body get rid of excess water (diuretic). Take this medicine if you are told to take it. General  instructions Eat a low-salt (low-sodium) diet as told by your health care provider. Sometimes, eating less salt may reduce swelling. Pay attention to any changes in your symptoms. Moisturize your skin daily to help prevent skin from cracking and draining. Keep all follow-up visits. This is important. Contact a health care provider if: You have a fever. You have swelling in only one leg. You have increased swelling, redness, or pain in one or both of your legs. You have drainage or sores at the area where you have edema. Get help right away if: You have edema that starts suddenly or is getting worse, especially if you are pregnant or have a medical condition. You develop shortness of breath, especially when you are lying down. You have pain in your chest or abdomen. You feel weak. You feel like you will faint. These symptoms may be an emergency. Get help right away. Call 911. Do not wait to see if the symptoms will go away. Do not drive yourself to the hospital. Summary Peripheral edema is swelling that is caused by a buildup of fluid. Peripheral edema most often affects the lower legs, ankles, and feet. Move around often to prevent stiffness and to reduce swelling. Do not sit or stand for long periods of time. Pay attention to any changes in your symptoms. Contact a health care provider if you have edema that starts suddenly or is getting worse, especially if you are pregnant or have a medical condition. Get help right away if you develop shortness of breath, especially when lying down.  This information is not intended to replace advice given to you by your health care provider. Make sure you discuss any questions you have with your health care provider. Document Revised: 07/18/2021 Document Reviewed: 07/18/2021 Elsevier Patient Education  2024 [employer].

## 2024-12-05 NOTE — Progress Notes (Signed)
" ° °  Subjective:    Patient ID: Randy [name], male    DOB: [birth year], 79 y.o.   MRN: [id number]   Chief Complaint: peripheral edema  HPI  Patient was seen 12/01/24. We applied unna boots. Her is here today for recheck. All labs at last visit were normal. Family removed unna boots Wednesday and put them back on Thursday.  Patient Active Problem List   Diagnosis Date Noted   Elevated ferritin level 11/17/2024   Benign prostatic hyperplasia with nocturia 10/06/2024   Frequent falls 10/06/2024   Bilateral leg edema 10/06/2024   Parkinson's disease (HCC) 02/14/2018   Psoriasis 12/18/2016   Essential hypertension 03/02/2014   Hyperlipidemia 03/02/2014   Family history of heart disease 03/02/2014       Review of Systems  Constitutional:  Negative for diaphoresis.  Eyes:  Negative for pain.  Respiratory:  Negative for shortness of breath.   Cardiovascular:  Negative for chest pain, palpitations and leg swelling.  Gastrointestinal:  Negative for abdominal pain.  Endocrine: Negative for polydipsia.  Skin:  Negative for rash.  Neurological:  Negative for dizziness, weakness and headaches.  Hematological:  Does not bruise/bleed easily.  All other systems reviewed and are negative.      Objective:   Physical Exam Constitutional:      Appearance: Normal appearance.  Cardiovascular:     Rate and Rhythm: Normal rate and regular rhythm.     Heart sounds: Normal heart sounds.  Pulmonary:     Effort: Pulmonary effort is normal.     Breath sounds: Normal breath sounds.  Musculoskeletal:     Right lower leg: Edema (1+) present.     Left lower leg: Edema (1+) present.  Skin:    General: Skin is warm.  Neurological:     General: No focal deficit present.     Mental Status: He is alert and oriented to person, place, and time.           Assessment & Plan:  Randy [name] in today with chief complaint of Recheck legs   1. Peripheral edema  (Primary) Improved Compression socks during the day Elevate legs when sitting Lasix  as ordered     The above assessment and management plan was discussed with the patient. The patient verbalized understanding of and has agreed to the management plan. Patient is aware to call the clinic if symptoms persist or worsen. Patient is aware when to return to the clinic for a follow-up visit. Patient educated on when it is appropriate to go to the emergency department.   Mary-Margaret Gladis, FNP    "

## 2025-01-07 ENCOUNTER — Ambulatory Visit: Admitting: Cardiovascular Disease

## 2025-03-02 ENCOUNTER — Ambulatory Visit: Admitting: Neurology

## 2025-03-16 ENCOUNTER — Inpatient Hospital Stay: Admitting: Oncology

## 2025-03-16 ENCOUNTER — Inpatient Hospital Stay

## 2025-05-04 ENCOUNTER — Ambulatory Visit: Admitting: Family Medicine
